# Patient Record
Sex: Female | Born: 1950 | Race: White | Hispanic: No | Smoking: Current every day smoker
Health system: Southern US, Community
[De-identification: ages and names within clinical notes are randomized; demographics above are authoritative.]

---

## 2010-01-29 ENCOUNTER — Telehealth (INDEPENDENT_AMBULATORY_CARE_PROVIDER_SITE_OTHER): Payer: Self-pay

## 2010-01-29 ENCOUNTER — Ambulatory Visit: Payer: Self-pay | Admitting: Emergency Medicine

## 2010-01-29 DIAGNOSIS — H60339 Swimmer's ear, unspecified ear: Secondary | ICD-10-CM | POA: Insufficient documentation

## 2010-05-24 NOTE — Progress Notes (Signed)
  Phone Note From Pharmacy   Caller: [facility]  Reason for Call: Patient requests substitution Summary of Call: Per physician, clld in change of prescription to Corticosporin Otic, 4 drops two times a day x10dys, 1 bottle to Marilyn/Pharmacist [address] - [place] - [phone]. Initial call taken by: Areta Haber CMA,  January 29, 2010 5:43 PM

## 2010-05-24 NOTE — Assessment & Plan Note (Signed)
Summary: R ear painx , HA, Cough-dry rm5   Vital Signs:  Patient Profile:   60 Years Old Female CC:      Cold & URI symptoms Height:     62 inches Weight:      161 pounds O2 Sat:      100 % O2 treatment:    Room Air Temp:     97.6 degrees F oral Pulse rate:   79 / minute Pulse rhythm:   regular Resp:     16 per minute BP sitting:   122 / 78  (left arm) Cuff size:   regular  Vitals Entered By: Areta Haber CMA (January 29, 2010 2:18 PM)                  Current Allergies: No known allergies History of Present Illness Chief Complaint: Cold & URI symptoms History of Present Illness: Pt with intermittant R ear pain for 3 years.  It comes and goes.  Hasn't seen a PCP.  Pain is dull and located R ear.  Occ drainage.  Some muffled hearing in R ear. She went to [facility] today and was told that she was impacted and that there was an infection.  Supposedly they wouldn't see her because they felt she needed pain medicines.  She is not a Counselling psychologist.  She uses Qtips after she showers. No sore throat No cough No pleuritic pain No wheezing No nasal congestion No post-nasal drainage No sinus pain/pressure No itchy/red eyes + earache No hemoptysis No SOB No chills/sweats No fever No nausea No vomiting No abdominal pain No diarrhea No skin rashes No fatigue No myalgias + headache   Current Problems: ACUTE SWIMMERS EAR (ICD-380.12)   Current Meds CIPRODEX 0.3-0.1 % SUSP (CIPROFLOXACIN-DEXAMETHASONE) 4 drops Right ear two times a day for 10 days  REVIEW OF SYSTEMS Constitutional Symptoms      Denies fever, chills, night sweats, weight loss, weight gain, and fatigue.  Eyes       Denies change in vision, eye pain, eye discharge, glasses, contact lenses, and eye surgery. Ear/Nose/Throat/Mouth       Complains of change in hearing, ear pain, and ear discharge.      Denies hearing loss/aids, dizziness, frequent runny nose, frequent nose bleeds, sinus problems,  sore throat, hoarseness, and tooth pain or bleeding.      Comments: R x 2 mths Respiratory       Complains of dry cough.      Denies productive cough, wheezing, shortness of breath, asthma, bronchitis, and emphysema/COPD.  Cardiovascular       Denies murmurs, chest pain, and tires easily with exhertion.    Gastrointestinal       Denies stomach pain, nausea/vomiting, diarrhea, constipation, blood in bowel movements, and indigestion. Genitourniary       Denies painful urination, kidney stones, and loss of urinary control. Neurological       Complains of headaches.      Denies paralysis, seizures, and fainting/blackouts. Musculoskeletal       Denies muscle pain, joint pain, joint stiffness, decreased range of motion, redness, swelling, muscle weakness, and gout.  Skin       Denies bruising, unusual mles/lumps or sores, and hair/skin or nail changes.  Psych       Denies mood changes, temper/anger issues, anxiety/stress, speech problems, depression, and sleep problems. Other Comments: Pt does not have a PCP.   Past History:  Past Medical History: Unremarkable  Past Surgical History:  Denies surgical history  Social History: Married Current Smoker - 1/2-1 pack daily Alcohol use-no Drug use-no Regular exercise-no Smoking Status:  current Drug Use:  no Does Patient Exercise:  no Physical Exam General appearance: well developed, well nourished, no acute distress.  older than stated age Head: normocephalic, atraumatic Ears: Left ear and TM normal.  Right TM normal.  Right canal swollen with mild exudate and cerumen present. Nasal: mucosa pink, nonedematous, no septal deviation, turbinates normal Oral/Pharynx: tongue normal, posterior pharynx without erythema or exudate Neck: no LAD present Chest/Lungs: no rales, wheezes, or rhonchi bilateral, breath sounds equal without effort Heart: regular rate and  rhythm, no murmur Skin: no obvious rashes or lesions MSE: oriented to time,  place, and person patient hears whisper in front and behind, but sometimes cocks her head to the side to listen Assessment New Problems: ACUTE SWIMMERS EAR (ICD-380.12)   Plan New Medications/Changes: CIPRODEX 0.3-0.1 % SUSP (CIPROFLOXACIN-DEXAMETHASONE) 4 drops Right ear two times a day for 10 days  #1 bottle x 0, 01/29/2010, Hoyt Koch MD  New Orders: No Charge Patient Arrived (NCPA0) [NCPA0] Planning Comments:   Will treat her Otitis externa with Ciprodex and OTC Ibuprofen.  Explained how to use medicine.  Advised her that she may use white vinegar otic once the medicine is gone to try to prevent further flares (a few times a week after she showers).  Avoid Qtips.  If she continues to have muffled hearing or problems after the course of medicine, she should seek further care (either to an audiologist to check hearing and/or ENT).   Follow-up with your primary care physician if not improving or if getting worse.   The patient and/or caregiver has been counseled thoroughly with regard to medications prescribed including dosage, schedule, interactions, rationale for use, and possible side effects and they verbalize understanding.  Diagnoses and expected course of recovery discussed and will return if not improved as expected or if the condition worsens. Patient and/or caregiver verbalized understanding.  Prescriptions: CIPRODEX 0.3-0.1 % SUSP (CIPROFLOXACIN-DEXAMETHASONE) 4 drops Right ear two times a day for 10 days  #1 bottle x 0   Entered and Authorized by:   Hoyt Koch MD   Signed by:   Hoyt Koch MD on 01/29/2010   Method used:   Print then Give to Patient   RxID:   [id number]   Orders Added: 1)  No Charge Patient Arrived (NCPA0) [NCPA0]

## 2011-06-17 ENCOUNTER — Emergency Department
Admission: EM | Admit: 2011-06-17 | Discharge: 2011-06-17 | Disposition: A | Discharge status: Home / Self Care | Payer: Self-pay | Source: Home / Self Care | Attending: Emergency Medicine | Admitting: Emergency Medicine

## 2011-06-17 ENCOUNTER — Emergency Department: Admit: 2011-06-17 | Discharge: 2011-06-17 | Disposition: A | Discharge status: Home / Self Care | Payer: Self-pay

## 2011-06-17 DIAGNOSIS — J189 Pneumonia, unspecified organism: Secondary | ICD-10-CM

## 2011-06-17 DIAGNOSIS — S43429A Sprain of unspecified rotator cuff capsule, initial encounter: Secondary | ICD-10-CM

## 2011-06-17 DIAGNOSIS — S46019A Strain of muscle(s) and tendon(s) of the rotator cuff of unspecified shoulder, initial encounter: Secondary | ICD-10-CM

## 2011-06-17 DIAGNOSIS — R509 Fever, unspecified: Secondary | ICD-10-CM

## 2011-06-17 DIAGNOSIS — J069 Acute upper respiratory infection, unspecified: Secondary | ICD-10-CM

## 2011-06-17 MED ORDER — AMOXICILLIN 500 MG PO CAPS: 500.0000 mg | ORAL_CAPSULE | Freq: Three times a day (TID) | ORAL | Status: DC

## 2011-06-17 MED ORDER — NAPROXEN 500 MG PO TABS: 500.0000 mg | ORAL_TABLET | Freq: Two times a day (BID) | ORAL | Status: AC | PRN

## 2011-06-17 MED ORDER — LEVOFLOXACIN 500 MG PO TABS: 750.0000 mg | ORAL_TABLET | Freq: Every day | ORAL | Status: AC

## 2011-06-17 MED ORDER — NEOMYCIN-POLYMYXIN-HC 3.5-10000-1 OT SUSP: 4.0000 [drp] | Freq: Three times a day (TID) | OTIC | Status: AC

## 2011-06-17 NOTE — ED Notes (Signed)
Right ear pain for years, upper left arm pain. Pt states its from her job.

## 2011-06-17 NOTE — ED Provider Notes (Signed)
History     CSN: [id number]  Arrival date & time 06/17/11  6962   First MD Initiated Contact with Patient 06/17/11 [phone]      Chief Complaint  Patient presents with  . Otalgia  . Arm Pain    (Consider location/radiation/quality/duration/timing/severity/associated sxs/prior treatment) HPI   this patient does not have insurance but comes in today complaining of multiple problems.  1) She is complaining of right ear pain for the last 3-4 years. She's been having some decreased hearing and some drainage out of the ear intermittently. She has not seen or establish with a primary care doctor or ENT. I asked him as she was here we gave her some ear drops that she has been using which seemed to help but she has not followed up with anybody else as advised.  2) she is also been complaining of fever and muscle aches and some right sided chest discomfort. She also states that she's been having some swelling on the anterior aspect of her chest. She is a smoker. She is not complaining of a cough. Very mild upper respiratory symptoms as well.  3) She is also complaining of left shoulder pain that hurts when she lifts up her left shoulder and arm.  His been going on for a while but she denies any falling tripping or accidents. She has not been taking any medications or using any modalities for her. Certain movements hurt her worse.   4)  She's also been complaining of cramps they're located it in her feet and in her right side. No GU issues or vaginal discomfort or dysuria.   No past medical history on file.  No past surgical history on file.  No family history on file.  History  Substance Use Topics  . Smoking status: Current Everyday Smoker  . Smokeless tobacco: Not on file  . Alcohol Use:     OB History    Grav Para Term Preterm Abortions TAB SAB Ect Mult Living                  Review of Systems  All other systems reviewed and are negative.    Allergies  Review of patient's  allergies indicates no known allergies.  Home Medications   Current Outpatient Rx  Name Route Sig Dispense Refill  . LEVOFLOXACIN 500 MG PO TABS Oral Take 1.5 tablets (750 mg total) by mouth daily. 10 tablet 0  . NAPROXEN 500 MG PO TABS Oral Take 1 tablet (500 mg total) by mouth 2 (two) times daily as needed. 40 tablet 0  . NEOMYCIN-POLYMYXIN-HC 3.5-10000-1 OT SUSP Right Ear Place 4 drops into the right ear 3 (three) times daily. 10 mL 0    There were no vitals taken for this visit.  Physical Exam  Nursing note and vitals reviewed. Constitutional: She is oriented to person, place, and time. She appears well-developed and well-nourished.  HENT:  Head: Normocephalic and atraumatic.  Right Ear: External ear and ear canal normal. Tympanic membrane is erythematous. Decreased hearing is noted.  Left Ear: Tympanic membrane, external ear and ear canal normal.  Nose: Mucosal edema and rhinorrhea present.  Mouth/Throat: Posterior oropharyngeal erythema present. No oropharyngeal exudate or posterior oropharyngeal edema.       Erythema in the ear canal on the right side with swelling  Eyes: No scleral icterus.  Neck: Neck supple.  Cardiovascular: Normal rate, regular rhythm and normal heart sounds.   Pulmonary/Chest: Effort normal and breath sounds normal. No respiratory  distress. She has no decreased breath sounds. She has no wheezes. She has no rhonchi.       She does have an area of swelling on her anterior chest. It feels soft and feels intercostal.  Musculoskeletal:       Left shoulder examination demonstrates full range of motion but pain with overhead  Movements.  Neer's and Hawkins examinations are positive for pain. No tenderness to palpation. Distal neurovascular status is intact.  Neurological: She is alert and oriented to person, place, and time.  Skin: Skin is warm and dry.  Psychiatric: She has a normal mood and affect. Her speech is normal.    ED Course  Procedures (including  critical care time)  Labs Reviewed - No data to display Dg Chest 2 View  06/17/2011  *RADIOLOGY REPORT*  Clinical Data: Chest pain  CHEST - 2 VIEW  Comparison: None.  Findings: Left basilar heterogeneous opacities.  Normal heart size. Bronchitic changes.  No pneumothorax.  IMPRESSION: Left basilar airspace disease.  Original Report Authenticated By: Donavan Burnet, M.D.     1. Acute upper respiratory infections of unspecified site       MDM  1) she seems to have an otitis externa the right ear. I've given her eardrops. I would like her to follow up with ENT, however she states that she cannot afford to see one. I would like her to do so because she is having decreased hearing from this and has been going on for years at a time.  2) we did a chest x-ray in clinic in the results are read by radiology as above. I have given her prescription for Levaquin. I would like her to stop smoking as well.  3) her left shoulder pain is likely due to rotator cuff strain she has some signs of impingement. I've given her a prescription for naproxen and told her to ice it. She likely needs followup with sports medicine provider but states she cannot afford that either.  4) cramps in both feet as well as in her legs and side may be due to simple cramps or may be due to an electrolyte imbalance. We cannot evaluate these today.  I've gotten the phone number for from the daughter since she seems to be the most reliable. I would have my office manager call the family early this week and hopefully set up some type of primary care visit since this patient definitely needs a primary care doctor to follow her if not multiple specialists. She is having multiple medical issues including pneumonia and arthritis and chronic ear infections and I think that without a physician she will tend to get worse and not better.  Lily Kocher, MD 06/17/11 1154

## 2014-03-18 ENCOUNTER — Emergency Department
Admission: EM | Admit: 2014-03-18 | Discharge: 2014-03-18 | Disposition: A | Discharge status: Home / Self Care | Payer: Self-pay | Source: Home / Self Care | Attending: Emergency Medicine | Admitting: Emergency Medicine

## 2014-03-18 ENCOUNTER — Emergency Department (INDEPENDENT_AMBULATORY_CARE_PROVIDER_SITE_OTHER): Payer: Self-pay

## 2014-03-18 DIAGNOSIS — M545 Low back pain, unspecified: Secondary | ICD-10-CM

## 2014-03-18 DIAGNOSIS — N3001 Acute cystitis with hematuria: Secondary | ICD-10-CM

## 2014-03-18 DIAGNOSIS — M47816 Spondylosis without myelopathy or radiculopathy, lumbar region: Secondary | ICD-10-CM

## 2014-03-18 LAB — POCT URINALYSIS DIP (MANUAL ENTRY)
Bilirubin, UA: NEGATIVE
Glucose, UA: NEGATIVE
Ketones, POC UA: NEGATIVE
Nitrite, UA: POSITIVE
Protein Ur, POC: NEGATIVE
Spec Grav, UA: 1.02 (ref 1.005–1.03)
Urobilinogen, UA: 1 (ref 0–1)
pH, UA: 5 (ref 5–8)

## 2014-03-18 MED ORDER — KETOROLAC TROMETHAMINE 60 MG/2ML IM SOLN
60.0000 mg | Freq: Once | INTRAMUSCULAR | Status: AC
  Administered 2014-03-18: 60 mg via INTRAMUSCULAR

## 2014-03-18 MED ORDER — CIPROFLOXACIN HCL 500 MG PO TABS: 500.0000 mg | ORAL_TABLET | Freq: Two times a day (BID) | ORAL | Status: DC

## 2014-03-18 MED ORDER — TRAMADOL-ACETAMINOPHEN 37.5-325 MG PO TABS: ORAL_TABLET | ORAL | Status: DC

## 2014-03-18 NOTE — ED Provider Notes (Signed)
CSN: [id number]     Arrival date & time 03/18/14  1919 History   First MD Initiated Contact with Patient 03/18/14 1947     Chief Complaint  Patient presents with  . Back Pain    x 2 days   [facility] Patient is a 63 y.o. female presenting with back pain.  Back Pain Location:  Lumbar spine Quality: Sharp. Radiates to:  Does not radiate Pain severity:  Severe Pain is:  Unable to specify Onset quality:  Gradual Duration:  2 days Timing:  Constant Progression:  Unchanged Chronicity:  New (Although she has chronic mild low back pain and arthritis diffusely, she feels the lumbar pain past 2 days is severe) Context: lifting heavy objects and twisting   Context: not falling, not jumping from heights, not pedestrian accident, not recent illness and not recent injury   Context comment:  She was stacking wood 3 days ago, then the next morning, the lumbar pain started Relieved by:  Being still Worsened by:  Movement Ineffective treatments:  Ibuprofen Associated symptoms: no abdominal pain, no bladder incontinence, no bowel incontinence, no chest pain, no dysuria, no fever, no headaches, no leg pain, no numbness, no paresthesias, no pelvic pain, no perianal numbness, no tingling and no weakness   Risk factors: no recent surgery    Past medical history of pneumonia 2013 treated effectively as an outpatient with an antibiotic and the pneumonia symptoms resolved. She states that otherwise she has not been to a medical doctor in about 30 years and she absolutely refuses to follow-up with a medical doctor. I explained the risks of not doing so. She denies any urinary symptoms currently. Denies hematuria or dysuria or urinary incontinence. Denies urinary frequency. Denies fever or chills or nausea or vomiting. She denies cough or chest pain or shortness of breath or wheezing. History reviewed. No pertinent past medical history. History  reviewed. No pertinent past surgical history. Family History  Problem Relation Age of Onset  . Heart attack Father   . Cancer Sister     Breast   History  Substance Use Topics  . Smoking status: Current Every Day Smoker -- 0.50 packs/day for 50 years    Types: Cigarettes  . Smokeless tobacco: Never Used  . Alcohol Use: No   OB History    No data available     Review of Systems  Constitutional: Negative for fever.  Cardiovascular: Negative for chest pain.  Gastrointestinal: Negative for abdominal pain and bowel incontinence.  Genitourinary: Negative for bladder incontinence, dysuria and pelvic pain.  Musculoskeletal: Positive for back pain.  Neurological: Negative for tingling, weakness, numbness, headaches and paresthesias.  All other systems reviewed and are negative.   Allergies  Review of patient's allergies indicates no known allergies.  Home Medications   Prior to Admission medications   Medication Sig Start Date End Date Taking? Authorizing Provider  ciprofloxacin (CIPRO) 500 MG tablet Take 1 tablet (500 mg total) by mouth 2 (two) times daily. 03/18/14   Lajean Manesavid Massey, MD  traMADol-acetaminophen (ULTRACET) 37.5-325 MG per tablet 1 or 2 every 4-6 hours as needed for severe pain.  Caution: May cause drowsiness 03/18/14   Lajean Manesavid Massey, MD   BP 118/79 mmHg  Pulse 86  Temp(Src) 97.4 F (36.3 C) (Oral)  Ht 5\' 2"  (1.575 m)  Wt 165 lb (74.844 kg)  BMI 30.17 kg/m2  SpO2 95% Physical Exam  Constitutional: She is oriented to person, place,  and time. She appears well-developed and well-nourished. No distress.  Here with daughter. Uncomfortable from lumbar pain. She points to the mid lumbar area.  HENT:  Head: Normocephalic and atraumatic.  Mouth/Throat: Oropharynx is clear and moist.  Poor dentition  Eyes: Conjunctivae and EOM are normal. Pupils are equal, round, and reactive to light. No scleral icterus.  Neck: Normal range of motion. Neck supple. No JVD present.    Cardiovascular: Normal rate, regular rhythm and normal heart sounds.   Pulmonary/Chest: Effort normal and breath sounds normal. No respiratory distress.  Abdominal: Soft. She exhibits no distension. There is no tenderness.  Musculoskeletal:       Lumbar back: She exhibits decreased range of motion, tenderness and bony tenderness. She exhibits no swelling, no laceration and no spasm.       Back:  Lymphadenopathy:    She has no cervical adenopathy.  Neurological: She is alert and oriented to person, place, and time. She displays normal reflexes. No cranial nerve deficit. She exhibits normal muscle tone.  Motor and sensory and DTRs of lower extremities are equal and intact  Skin: Skin is warm. No rash noted. She is not diaphoretic.  Psychiatric: She has a normal mood and affect.  Nursing note and vitals reviewed.  straight leg raise test negative. No hip tenderness or deformity  ED Course  Procedures (including critical care time) Labs Review Labs Reviewed  POCT URINALYSIS DIP (MANUAL ENTRY) - Abnormal; Notable for the following:   URINE CULTURE   Results for orders placed or performed during the hospital encounter of 03/18/14  POCT urinalysis dipstick (new)  Result Value Ref Range   Color, UA yellow    Clarity, UA clear    Glucose, UA neg    Bilirubin, UA negative    Bilirubin, UA negative    Spec Grav, UA 1.020 1.005 - 1.03   Blood, UA trace-lysed    pH, UA 5.0 5 - 8   Protein Ur, POC negative    Urobilinogen, UA 1.0 0 - 1   Nitrite, UA Positive    Leukocytes, UA small (1+)     Discussed imaging options with patient and daughter. I advised x-ray LS-spine and they consented to do 2-3 view x-ray LS-spine.  Imaging Review Dg Lumbar Spine 2-3 Views  03/18/2014   CLINICAL DATA:  Low back pain for 2 days.  EXAM: LUMBAR SPINE - 2-3 VIEW  COMPARISON:  None.  FINDINGS: Degenerative facet disease in the lower lumbar spine with bony overgrowth. Normal alignment. Disc spaces are  maintained. No fracture. SI joints and hip joints are symmetric.  IMPRESSION: No acute bony abnormality.  Degenerative facet disease.   Electronically Signed   By: Charlett NoseKevin  Dover M.D.   On: 03/18/2014 20:16     MDM      1 Low back pain   2 Acute cystitis with hematuria    Urinalysis is positive for trace blood, positive nitrite and small leukocytes. Patient admits it might not be a perfect clean catch UA. (She refuses to repeat UA)  No urinary symptoms, but I'm suspicious of acute cystitis as a secondary diagnosis. I doubt that her acute lumbar pain is from a kidney stone, as she has point tenderness over midline lumbar area, and pain is exacerbated with movement.  X-ray LS-spine shows no acute bony abnormality. There is degenerative facet disease. She likely has acute lumbar strain. Also has  asymptomatic acute cystitis.--No CVA tenderness.  Treatment options discussed, as well as risks, benefits, alternatives.  Patient and daughter voiced understanding and agreement with the following plans: Send off urine for culture. Toradol 60 mg IM stat.-She tolerated this well without reaction, was observed, and lumbar pain improved somewhat afterward. New Prescriptions   CIPROFLOXACIN (CIPRO) 500 MG TABLET    Take 1 tablet (500 mg total) by mouth 2 (two) times daily.   TRAMADOL-ACETAMINOPHEN (ULTRACET) 37.5-325 MG PER TABLET    1 or 2 every 4-6 hours as needed for severe pain.  Caution: May cause drowsiness   discussed relative rest and gradually increase activity. Heat and/or cold packs. Explained the need to follow-up with her PCP for follow-up of back pain abnormal urinalysis. And I explained the need for follow-up with PCP ongoing for preventative and acute care. Names and phone numbers of PCPs given. I explained if she does not follow-up with PCP, she has risks of other undiagnosed medical problems which could lead to severe morbidity or even death. She and daughter voiced understanding, but  patient still refuses to follow-up with her PCP. Advised her to quit smoking and explained the risks of smoking, but she refuses to quit.  Follow-up with ortho or primary care doctor in 5-7 days if not improving, or sooner if symptoms become worse. Precautions discussed. Red flags discussed.--- Emergency room if any red flag Questions invited and answered. Patient and daughter voiced understanding and agreement. Over 40 minutes spent, greater than 50% of the time spent for counseling and care.   Lajean Manes, MD 03/18/14 [phone]

## 2014-03-18 NOTE — ED Notes (Signed)
Stefanie Wilson complains of low back pain for 2 days. Pain is 10/10 and is worse when beginning to stand and walking. She did take some ibuprofen this morning with no relief. Denies fever, chills, sweats or urinary problems.

## 2014-03-21 LAB — URINE CULTURE: Colony Count: >=100000

## 2014-03-22 ENCOUNTER — Telehealth: Payer: Self-pay

## 2014-03-23 ENCOUNTER — Telehealth: Payer: Self-pay | Admitting: Emergency Medicine

## 2014-03-23 NOTE — Discharge Instructions (Signed)

## 2017-05-15 DIAGNOSIS — H25811 Combined forms of age-related cataract, right eye: Secondary | ICD-10-CM | POA: Insufficient documentation

## 2017-05-15 NOTE — Anesthesia Preprocedure Evaluation (Addendum)
  Relevant Problems  No relevant active problems     Anesthesia Evaluation    No history of anesthetic complications  Airway  Mallampati: II    Neck ROM: full  Dental   (+) lower dentures and upper dentures   Pulmonary   (+) tobacco use, a smoker pack-years,  Counseling provided  Cardiovascular - negative ROS   Neuro/Psych    Comments: Hard of hearing: wears bilat hearing aids.  GI/Hepatic/Renal - negative ROS    Endo/Other - negative ROS   Abdominal       Other ROS/MED HX:  HPI: cataract       Anesthesia Plan    ASA 2   MAC  (Multimodal analgesia plan: acetaminophen  and gabapentin  ordered pre-operatively. Patient seen. Chart reviewed. All questions answered. Ready for anesthesia and surgery. Alm Dayhoff, MD  05/15/2017   8:14 AM  ) Anesthetic plan and risks discussed with patient.     Plan discussed with CRNA.

## 2017-05-24 DIAGNOSIS — H25812 Combined forms of age-related cataract, left eye: Secondary | ICD-10-CM | POA: Insufficient documentation

## 2019-03-04 DIAGNOSIS — F17210 Nicotine dependence, cigarettes, uncomplicated: Secondary | ICD-10-CM | POA: Insufficient documentation

## 2019-03-04 DIAGNOSIS — Z974 Presence of external hearing-aid: Secondary | ICD-10-CM | POA: Insufficient documentation

## 2019-03-04 NOTE — Progress Notes (Signed)
 Subjective   Patient ID:  Stefanie Wilson is a 68 y.o. (DOB 08-08-1950) female.   Chief Complaint  Patient presents with  . Get established     [name] is a new patient to our practice who presents today to establish care.  She has not been seen by primary care in over 35 years and has not had any health maintenance activities recently.  She reports she is overall healthy.  She does have a history of cataracts surgery on bilateral eyes in 2019.  Her vision is now 20/20 but she does require reading glasses occasionally.  Patient does use hearing aids but denies any additional past medical history.  Patient is a current everyday smoker smoking approximately 0.5 packs/day.  She is not been able to successfully quit smoking in the past.  She is not interested in smoking cessation at this time.  She denies history of COPD or any significant symptoms including shortness of breath, cough, dyspnea on exertion.  She has not taken pharmacotherapy in the past to help quit smoking and is not interested in initiating this today.  Patient has never had a mammogram.  She denies family history of breast cancer.  She does not do self breast exams and denies any concerns.  She declines mammogram referral today.  Patient has never had any colon cancer screening.  She declines colonoscopy or Cologuard but is open to IFOB annual testing.  She denies any changes to bowel habits, abdominal pain, melena, hematochezia.  She does not receive vaccines on regular basis and declines flu shot, zoster vaccine, Tdap, and pneumonia vaccines today.  She has not had a Pap smear in more than 35 years.  She denies history of abnormal Pap smears and is open to screening today.  Patient is currently fasting and is open to having a lipid panel and fasting glucose level obtained today.  Patient reports the only reason she is here is because her insurance told her that she did not have a physical within a certain timeframe she  would lose the insurance coverage.  She is not interested in many of the health maintenance activities.  She reports she has lived a healthy life and does not take medication on a regular basis.  She has no additional complaints or concerns today.   Reviewed and updated this visit by provider: Tobacco  Allergies  Meds  Problems  Med Hx  Surg Hx  Fam Hx        Review of Systems Review of Systems  Constitutional: Negative for activity change, appetite change, fatigue and fever.  HENT: Negative for congestion, sinus pressure, sneezing and sore throat.   Eyes: Negative for visual disturbance.  Respiratory: Negative for cough and shortness of breath.   Cardiovascular: Negative for chest pain.  Gastrointestinal: Negative for abdominal pain, diarrhea, nausea and vomiting.  Musculoskeletal: Negative for arthralgias and myalgias.  Skin: Negative for rash.  Neurological: Negative for dizziness, light-headedness and headaches.  Psychiatric/Behavioral: Negative for sleep disturbance. The patient is not nervous/anxious.      Objective   Vitals:   03/04/19 1054  BP: 120/80  BP Location: Right arm  Patient Position: Sitting  Pulse: 81  Temp: 97.7 F (36.5 C)  TempSrc: Skin  SpO2: 96%  Weight: 160 lb 8 oz (72.8 kg)  Height: 5' 1 (1.549 m)    Physical Exam Vitals signs reviewed. Exam conducted with a chaperone present.  Constitutional:      General: She is not in acute  distress.    Appearance: Normal appearance. She is well-developed.     Comments: Very pleasant female appears stated age in no acute distress  HENT:     Head: Normocephalic and atraumatic.     Right Ear: Tympanic membrane, ear canal and external ear normal. There is no impacted cerumen. Tympanic membrane is not erythematous or bulging.     Left Ear: Tympanic membrane, ear canal and external ear normal. There is no impacted cerumen. Tympanic membrane is not erythematous or bulging.     Ears:     Comments: TMs  opaque bilaterally.    Nose: Nose normal.     Mouth/Throat:     Dentition: Has dentures.     Pharynx: Uvula midline. No oropharyngeal exudate or posterior oropharyngeal erythema.  Eyes:     Extraocular Movements: Extraocular movements intact.     Conjunctiva/sclera: Conjunctivae normal.     Pupils: Pupils are equal, round, and reactive to light.  Neck:     Musculoskeletal: Normal range of motion and neck supple.  Cardiovascular:     Rate and Rhythm: Normal rate and regular rhythm.     Heart sounds: S1 normal and S2 normal. No murmur.  Pulmonary:     Effort: Pulmonary effort is normal. No accessory muscle usage or respiratory distress.     Breath sounds: Normal breath sounds. No wheezing, rhonchi or rales.     Comments: Clear to auscultation bilaterally Chest:     Breasts:        Right: No swelling, bleeding or inverted nipple.        Left: No swelling, bleeding or inverted nipple.  Abdominal:     Palpations: Abdomen is soft.     Tenderness: There is no abdominal tenderness.  Genitourinary:    Labia:        Right: No rash or tenderness.        Left: No rash or tenderness.      Vagina: Normal. No vaginal discharge or erythema.     Cervix: Normal.     Uterus: Normal.      Adnexa: Right adnexa normal and left adnexa normal.     Comments: Burnard Cable, CMA present as chaperone during exam Lymphadenopathy:     Head:     Right side of head: No submental, submandibular or tonsillar adenopathy.     Left side of head: No submental, submandibular or tonsillar adenopathy.     Cervical: No cervical adenopathy.  Neurological:     Mental Status: She is alert.  Psychiatric:        Mood and Affect: Mood normal.        Speech: Speech normal.        Behavior: Behavior is cooperative.    PHQ9    Depression Screen 03/04/2019 05/24/2017 05/15/2017   Please choose the category that best describes the patient's current state 0 - -   Not eligible on the basis of Not applicable - -   1. Little  interest or pleasure in doing things 0 - -   2. Feeling down, depressed, or hopeless 0 - -   PHQ-2 Total Score 0 - -   Wish to be Dead: - No No   Suicidal Thoughts: - No No   Suicide Behavior Question: - No No       --------------------------- Labs last 12 hours No results found for this or any previous visit (from the past 12 hour(s)).  Imaging last 24 hours (if applicable)  Radiology interpretation  No results found.   Impression/Plan   1. History of cataract (Primary)  Patient reports she is doing well and has scheduled follow-up with ophthalmology.  She was instructed to contact us  with any questions or concerns.  2. Cigarette smoker  Discussed at length the importance of smoking cessation for overall health.  Patient reports she is not interested in smoking cessation at this time.  She has slowly decreased the amount of cigarettes that she is consuming daily but does not intend to completely quit now or in the future.  We discussed possible smoking cessation strategies including pharmacotherapy should she ever change her mind.  If she is interested in smoking cessation in the future she can contact us .  3. Hearing aid worn  Patient is followed by audiologist and reports she is doing well.  4. Colon cancer screening -     POCT IMMUNO FECAL OCCULT BLOOD(IFOB)  Patient declined colonoscopy and Cologuard but was open to annual IFOB testing.  Will make additional recommendations based on laboratory findings.  5. Immunization refused  Patient refused immunizations today.  6. Mammogram declined  Patient declined mammogram referral.  Clinical breast exam was normal in office.  She was encouraged to do monthly breast exams and contact us  with any concerns.  7. Screening for endocrine, metabolic and immunity disorder 8. Screening, lipid -     Lipid Panel -     Comprehensive metabolic panel  Patient is currently fasting for labs so screening lipid panel and CMP including  fasting glucose level were obtained.  Will make recommendations based on laboratory findings.  9. Cervical cancer screening -     IGP, HPV, Aptima High-Risk  Pap smear was collected today given patient has not had regular screenings with last Pap greater than 35 years ago.  Will make recommendations based on laboratory findings.  10. Encounter to establish care PHQ2 score of 0 today.  Patient is scheduled for AWV next week.  Will readdress health maintenance activities at this visit.  See encounter after visit summary for patient specific instructions.  Follow up in about 1 week (around 03/11/2019) for AWV.   Orders Placed This Encounter  Procedures  . Lipid Panel  . Comprehensive metabolic panel  . POCT IMMUNO FECAL OCCULT BLOOD(IFOB)  . IGP, HPV, Aptima High-Risk     Patient's Medications    as of March 04, 2019 12:02 PM   You have not been prescribed any medications.     Risks, benefits, and alternatives of the medications and treatment plan prescribed today were discussed, and patient expressed understanding. Plan follow-up as discussed or as needed if any worsening symptoms or change in condition.    A yearly health maintenance exam was recommended where appropriate.   This documented was generated using voice recognition software. There may be unintended transcription errors that were not detected upon document review.

## 2019-03-11 DIAGNOSIS — E1169 Type 2 diabetes mellitus with other specified complication: Secondary | ICD-10-CM | POA: Insufficient documentation

## 2019-03-11 DIAGNOSIS — E782 Mixed hyperlipidemia: Secondary | ICD-10-CM | POA: Insufficient documentation

## 2019-03-12 NOTE — Progress Notes (Signed)
 Medicare Initial AWV   Stefanie Wilson is a 68 y.o. female who presents for her first annual wellness visit for Medicare.  Any physical exam components or additional concerns beyond the scope of the Annual Wellness Visit may be documented in a separate note within this encounter.  Health Risk Assessment   Current Living Arrangement: Spouse/Significant Other Social Support System: Family During the past four weeks, how much pain in your body have you had on a scale of 0-10?: No pain (0) During the past four weeks, was someone available to help you if you needed and wanted help?: Yes, as much as I wanted During the past four weeks, what was the hardest level of physical activity you could do for at least two minutes?: Very heavy In the past seven days, how many days did you exercise?: 7 In the past seven days, how many minutes per day did you exercise?: 60 Each night, how many hours of sleep do you usually get?: 4 Do you snore or has anyone told you that you snore?: No Do you always fasten your seat belt when you are in a car?: Yes Do you drive after drinking alcohol or ride with a driver who has been drinking?: No During the past four weeks, how many drinks of wine, beer or other alcoholic beverages did you have?: No alcohol at all How often during the past four weeks have you been bothered by falling or dizziness when standing up?: Never How often during the past four weeks have you been bothered by sexual problems?: Never Do any of the following describe you?  Multiple sex partners and/or intercourse with partner of the same sex: No How often during the past four weeks have you been bothered by trouble eating well?: Never How often during the past four weeks have you been bothered by teeth or denture problems?: Never How often during the past four weeks have you been bothered by tiredness or fatigue?: Never What is your current stress level?: Low Does stress affect the quality of  your daily life?: No During the past four weeks, how would you rate your health in general?: Excellent What is your race? (Check all that apply): White  Depression Screening   Depression Screen 03/12/2019 03/04/2019 05/24/2017 05/15/2017  Please choose the category that best describes the patient's current state 0 0 - -  Not eligible on the basis of Not applicable Not applicable - -  1. Little interest or pleasure in doing things 0 0 - -  2. Feeling down, depressed, or hopeless 0 0 - -  PHQ-2 Total Score 0 0 - -  Wish to be Dead: - - No No  Suicidal Thoughts: - - No No  Suicide Behavior Question: - - No No  Some recent data might be hidden     Cognitive and Functional Assessments   Is the person deaf or does he/she have serious difficulty hearing?: (!) Yes(wears hearing aids) Is this person blind or does he/she have serious difficulty seeing even when wearing glasses?: No Do you/patient have serious difficulty concentrating, remembering, or making decisions?: No Have you had any concerns about changes in your memory or concentration?: No   Max Score Patient Score Mini-Mental Status Examination (MMSE) Questions  5 4 "What is the year? Season? Date? Day? Month?" (1 point each correct answer)  5 5 "Where are we now - Country? State? County? Town/city? Type of Building/Business? (1 point each correct answer)  3 3 The examiner names  three unrelated objects (blue, hope, tree) clearly and slowly, then the instructor asks the patient to name all three of them. The patient's response is used for scoring. (1 point each correct answer) The examiner repeats them until patient learns all of them, if possible.  5 4 "I would like you to count backward from 100 by sevens." (93, 86, 79, 72, 65), 1 point for each successful step.  Alternative for those not good at math: "Spell WORLD backwards." ([id number]), 1 point for each successful letter in reverse order.    3 2 "Earlier I told you the names of  three things. Can you tell me what those were?"  1 point for each remembered item.  2 2 Show the patient two simple objects, such as a wristwatch and a pencil, and ask the patient to name them. (1 point each)  1 1 "Repeat the phrase: 'No ifs, ands, or buts.'"  3 3 "Take the paper in your right hand, fold it in half, and put it on the floor." (The examiner gives the patient a piece of blank paper and scores 1 point for each step taken correctly.)  1 1  Write "Close your eyes." on the paper, give it to the patient and say, "Please read this and do what it says."  1 1 "Make up and write a sentence about anything." (This sentence must contain a noun and a verb and have correct punctuation.)   1 1 Ask the patient to draw a clock face, with an hour and minute hand showing the time at 5:45.  30 27 Total   Patient did have some initial difficulty with Mini-Mental status related to education.   ADL Assessment   Does this person have serious difficulty walking or climbing stairs?: No What devices do you use to assist with ambulation?: None Does this person have difficulty dressing or bathing?: No Do you have difficulty eating or preparing food?: No Do you have difficulty using the toilet?: No Do you have difficulty moving around from place to place?: No Do you have difficulty managing your finances?: No Do you have difficulty shopping or going to appointments without assistance?: No Do you have difficulty using the telephone?: No Do you have difficulty managing your medications?: No Who helps you with your medications?: No one Do you have difficulty doing housework or laundry?: No Do you have difficulty driving?: No(patient doesn't drive) How do you obtain transportation?: Family member  Advance Care Directives   Do you have a living will?: (!) No Do you have a Midwife?: (!) No  Falls Risk Screening       Patient can ambulate: Yes In the last year, have you had 2 or  more falls, trips or stumbles where you landed on the ground?: No In the last year, have you had 1 or more falls, trips or stumbles where you hurt yourself?: No  Intervention: Patient information provided on ways to reduce risk of falls  Medicare Required Components   Social History section has been updated:: Yes The following additional chart sections were updated:: Past Medical History, Past Surgical History, Medications, Allergies, Family History  Cognitive screen indicated?: No Based on my direct observation, with due consideration of information obtained via beneficiary reports and communication with family members/care takers, further cognitive assessment is not indicated.  Cognitive screen performed?: Yes Does patient have difficulty with hearing?: Yes(has hearing aids) Audiometric assessment and/or referral to Audiology ordered:: No(patient declined) Cardiac Risk Factors:: Advanced age (female  43 or older, female 42 or older), Dyslipidemia, Family history of premature CVA, Tobacco use, Obesity (BMI 30 kg/m2 or greater) Dietary issues addressed:: Yes HRA completed and reviewed:: Yes Care Team updated:: Yes Advance care directives discussed and information provided if necessary:: Yes   Patient Care Team: Rocky MARLA Gilford, PA-C as PCP - General (Physician Assistant) Belvie DELENA Novel, MD as Consulting Physician (Ophthalmology)  Vitals and Data    Vitals:   03/12/19 1325  BP: 116/70  Pulse: 83  Temp: 97.5 F (36.4 C)  TempSrc: Skin  Resp: 12  Height: 5' 1 (1.549 m)  Weight: 163 lb (73.9 kg)  SpO2: 93%  BMI (Calculated): 30.8  PainSc: 0-No pain    Hearing  Hearing Screening   125Hz  250Hz  500Hz  1000Hz  2000Hz  3000Hz  4000Hz  6000Hz  8000Hz   Right ear:   Fail Fail Fail  Fail    Left ear:   Fail Fail Fail  Fail    Comments: Patient wears hearing aids.  Patient was offered referral to audiology specialist which she declined.  She has someone she has worked with for her  hearing aids in the past and would like to go back to them when it is time to update her hearing aids.  Disposition   1. Medicare annual wellness visit, initial    2. Mammogram declined    3. Immunization refused    4. Colonoscopy refused      Follow up in about 3 months (around 06/12/2019) for needs 30 minutes.  No orders of the defined types were placed in this encounter.     Patient's Medications       Accurate as of March 12, 2019  1:58 PM. Reflects encounter med changes as of last refresh        Continued Medications     Instructions  atorvastatin  20 mg tablet Commonly known as: LIPITOR  20 mg, Oral, Daily        Health maintenance issues including appropriate cancer screening, annual eye exam, healthy diet, exercise and tobacco avoidance were discussed with the patient.  A written plan was provided to the patient in the form of patient instructions in the after visit summary.  Again discussed the importance of cancer screening including mammogram which patient declined.  She also declined colonoscopy or other forms of colon cancer screening.  Discussed that she is due for several vaccinations but she declined all of these.

## 2019-03-14 NOTE — Progress Notes (Signed)
 Chart reviewed complete for [employer] forms project.  Form submitted for AWV DOS 03/12/19.

## 2019-06-11 NOTE — Progress Notes (Signed)
 Subjective    Patient ID:  Stefanie Wilson is a 69 y.o. (DOB 10-Nov-1950) female.     Patient presents with  . Back Pain    X 6 days  bending to pick up wood- has had back pain in the past    No notes on file   Patient presents accompanied with her husband with complaint of lower back pain x 06/06/2019.[name]  Patient states that she bent down to pick a piece of wood from her porch and that is when she felt this pain.  She states she has had similar symptoms in the past but denies a history of chronic back pain. She is also fasting today, needs labs done to follow-up on lipids since she was started on statin following her labs in November 2020.   Aggravating factors: Coughing, bending over, walking, turning in bed Alleviating factors: Heating pad, Ice, Advil-no help  Sleep is decreased due to this pain.  denies numbness, tingling, and radiculopathy of the lower extremities.  denies lower extremity weakness. Negative loss of bowel or bladder control.  denies saddle anesthesia.   No difficulty urinating. Negative dysuria, urgency, or frequency. mild muscle spasm. Symptoms do worsen with change of position.  Functionality-no limitations with ADLs.  Denies associated appetite changes, fever, chills, fecal or urinary incontinence, neurological complaints, night sweats or weight loss.  Denies recent or remote direct trauma to lower back or falls.  Denies history of diabetes mellitus, IV drug use, cancer.    Reviewed and updated this visit by provider: Tobacco  Allergies  Meds  Problems  Med Hx  Surg Hx  Fam Hx        Review of Systems  Constitutional: Negative for appetite change, chills, fatigue, fever and unexpected weight change.  HENT: Negative for congestion, rhinorrhea, sinus pressure and sore throat.   Respiratory: Negative for cough, shortness of breath and wheezing.   Cardiovascular: Negative for chest pain, palpitations and leg swelling.  Gastrointestinal:  Negative for abdominal pain, blood in stool, constipation, diarrhea, nausea and vomiting.  Skin: Negative for color change, rash and wound.  Psychiatric/Behavioral: Negative for agitation, behavioral problems and confusion.     Objective   BP 140/80 (BP Location: Right arm, Patient Position: Sitting)   Pulse 78   Temp 97.1 F (36.2 C) (Skin)   Resp 18   Ht 5' 1 (1.549 m)   Wt 169 lb (76.7 kg)   SpO2 96%   Breastfeeding No   BMI 31.93 kg/m  No LMP recorded. Patient is postmenopausal. 24 Hour Pain Score (last day)    Date/Time  Pain Score   06/11/19 0932   Nine          Physical Exam  General:  Well Developed, Well Nourished, No distress Neck:  Supple with normal range of motion, No lymphadenopathy  Cardiovascular:  Regular rate and rhythm Lungs:  Clear to auscultation with normal effort Abdomen:  Bowel sounds active, soft, non-tender, non-distended, no hepatosplenomegaly or masses palpable.  No CVA tenderness.  Skin:  No Focal rashes, bruising, or contusions.    Musculoskeletal/Neuro:   No redness, deformity, bruising, swelling noted on inspection. No mass or swelling noted on palpation.  5/5 strength and resistance bilaterally.   DTR 2+ and equal bilaterally in the lower extremeties.  Pedal pulses intact 2+ = bilaterally.   Distal sensation intact.  ROM:  Limited due to pain- Forward flexion and extension, but has full lateral flexion, rotation with some pain on extremes  No  point tenderness to T,L-spine or sacrum.  TTP of paraspinal lower lumbar area noted predominantly on the right side.  No obvious muscle spasms .    Antalgic gait, no assistive devices used  Negative straight leg raise bilaterally.   Walks and climbs onto table with some difficulty and needed assistance      Assessment   1. Acute midline low back pain without sciatica  predniSONE (DELTASONE) 10 mg tablet   methocarbamol (ROBAXIN) 500 MG tablet  2. Class 1 obesity due to excess calories  without serious comorbidity with body mass index (BMI) of 31.0 to 31.9 in adult  Comprehensive Metabolic Panel   Hemoglobin A1c   Lipid Panel   Albumin/Creatinine Ratio, Random Urine   CBC   CBC   Albumin/Creatinine Ratio, Random Urine   Lipid Panel   Hemoglobin A1c   Comprehensive Metabolic Panel  3. Mixed hyperlipidemia  Comprehensive Metabolic Panel   Hemoglobin A1c   Lipid Panel   Albumin/Creatinine Ratio, Random Urine   CBC   CBC   Albumin/Creatinine Ratio, Random Urine   Lipid Panel   Hemoglobin A1c   Comprehensive Metabolic Panel  4. Cigarette smoker    5. Impaired fasting glucose  Comprehensive Metabolic Panel   Hemoglobin A1c   Lipid Panel   Albumin/Creatinine Ratio, Random Urine   CBC   CBC   Albumin/Creatinine Ratio, Random Urine   Lipid Panel   Hemoglobin A1c   Comprehensive Metabolic Panel    Plan   I do not see any indication for imaging at this time.  It looks like a musculoskeletal strain.  Offered steroid injection intramuscularly but patient declined.  States she had it in the past and it did not help.   Medications and instructions as noted below as prescribed below.  Patient Instructions  For back pain-  You may ambulate as tolerated.   I do not recommend bedrest.  Please avoid lifting > 10 lbs, no pushing or pulling of > 20 lbs of force, no bending, no stooping, and no squatting or climbing for 7 days  Apply ice/heat as directed to the affected area for 10 minutes 3-4 times a day.  You may use a back brace as needed.  Once you finish prednisone, you may start taking You may take extra strength Tylenol  or over-the-counter ibuprofen 600 mg with food-3-4 times a day (every6-8 hours)or over-the-counter Aleve  2 tablets with food up to 2 times a day as needed for pain.   Do not take over-the-counter or prescription ibuprofen, Aleve , Aspirin or any other NSAIDs or Cox 2 inhibitors like Celebrex, meloxicam while taking prednisone.  Tylenol  is all  you can take for pain.  You may take Tylenol  [phone] mg up to 4 times a day as needed for pain   You can try over-the-counter ICY HOT patches that contain lidocaine and menthol for pain relief.   Please do not take Methocarbamol and drive, operate machinery or participate in any activity that requires prolonged concentration or focusing as this medication can cause drowsiness/sedation, may impair your judgment which could lead to accidents.   Please follow-up immediately at the office or the urgent care clinic/ER for worsening back pain, numbness or tingling to extremities, change to bowel movements or bladder function.       Follow up in 9 months (on 03/12/2020), or if symptoms worsen or fail to improve, for AWV, Follow up with PCP.      Patient's Medications       Accurate as  of June 11, 2019  4:58 PM. Reflects encounter med changes as of last refresh        New Prescriptions     Instructions  methocarbamol 500 MG tablet Commonly known as: ROBAXIN Started by: Joylin G Dsa, MD  500 mg, Oral, 3 times a day as needed   predniSONE 10 mg tablet Commonly known as: DELTASONE Started by: Joylin G Dsa, MD  Take with meals-5 tabs daily for 2 days,4 tabs daily for 2 days,3 tabs daily for 2 days,2 tabs daily for 2 days,1 tab daily for 2 days &STOP      Continued Medications     Instructions  atorvastatin  20 mg tablet Commonly known as: LIPITOR  20 mg, Oral, Daily      Discontinued Medications   ADVIL 200 MG Caps Generic drug: Ibuprofen Stopped by: Joylin G Dsa, MD         Patient verbalized understanding of plan, agrees and had no unanswered questions at the end of this visit.    Electronically signed by Joylin G Dsa, MD 06/11/2019 4:58 PM    Note: Voice-recognition software was used in the creation of this documentation. Unintended transcription errors may have escaped editorial review.

## 2019-06-21 NOTE — ED Notes (Signed)
 Creat 0.7

## 2019-06-21 NOTE — ED Provider Notes (Addendum)
 [facility]  ED Provider Note  Lyndzie Zentz 69 y.o. female DOB: May 28, 1950 MRN: [id number] History   Chief Complaint  Patient presents with  . Abdominal Pain    reports pain to right side since Thursday, states has had vomiting since last night    History provided by:  Patient Abdominal Pain Pain location:  RLQ and R flank Pain quality:  Aching Pain radiates to:  Does not radiate Onset quality:  Gradual Timing:  Constant Progression:  Worsening Chronicity:  New Relieved by:  Nothing Worsened by:  Nothing Associated symptoms: nausea and vomiting   Associated symptoms: no chest pain, no chills, no constipation, no cough, no diarrhea, no dysuria, no fatigue, no fever, no hematemesis, no hematochezia, no hematuria and no shortness of breath       Past Medical History:  Diagnosis Date  . Cataracts, bilateral   . Hyperlipidemia     Past Surgical History:  Procedure Laterality Date  . Eye surgery Right 05/15/2017   CATARACT W/ IOL IMPLANT  . Eye surgery Bilateral    CATARACT by Dr Gennie  . Tubal ligation      Social History   Substance and Sexual Activity  Alcohol Use No   Social History   Tobacco Use  Smoking Status Current Every Day Smoker  . Packs/day: 0.50  Smokeless Tobacco Never Used   E-Cigarettes  . Vaping Use Never User   . Start Date    . Cartridges/Day    . Quit Date     Social History   Substance and Sexual Activity  Drug Use No   Tetanus up to date?: Yes Immunizations Up to Date?: Yes   No Known Allergies  Discharge Medication List as of 06/21/2019  9:54 AM    CONTINUE these medications which have NOT CHANGED   Details  atorvastatin  (LIPITOR) 20 mg tablet Take one tablet (20 mg dose) by mouth daily., Starting Tue 03/11/2019, Until Wed 03/10/2020, Normal    methocarbamol (ROBAXIN) 500 MG tablet Take one tablet (500 mg dose) by mouth 3 (three) times a day as needed (Back pain)., Starting Wed  06/11/2019, Normal    !! predniSONE (DELTASONE) 10 mg tablet Take with meals-5 tabs daily for 2 days,4 tabs daily for 2 days,3 tabs daily for 2 days,2 tabs daily for 2 days,1 tab daily for 2 days &STOP, Normal     !! - Potential duplicate medications found. Please discuss with provider.      Review of Systems   Review of Systems  Constitutional: Negative for chills, fatigue and fever.  Respiratory: Negative for cough and shortness of breath.   Cardiovascular: Negative for chest pain.  Gastrointestinal: Positive for abdominal pain, nausea and vomiting. Negative for constipation, diarrhea, hematemesis and hematochezia.  Genitourinary: Negative for dysuria and hematuria.  All other systems reviewed and are negative.   Physical Exam   ED Triage Vitals [06/21/19 0735]  BP (!) 186/88  Heart Rate 76  Resp 18  SpO2 97 %  Temp 98.5 F (36.9 C)    Physical Exam Nursing note and vitals reviewed.  Physical Exam Constitutional: Patient is in no acute distress Head: Normocephalic and atraumatic. Eyes: Extraocular motion intact, no scleral icterus Neck: Supple without meningismus, mass, or overt JVD Respiratory: Effort normal and breath sounds normal. No respiratory distress. CV: heart: normal rate and rhythm, no obvious murmurs.  Pulses +2 and symmetric Abdomen: Soft, non-tender, non-distended MSK: Extremities are atraumatic without deformity, ROM intact, no edema Skin:  Warm, dry, intact Neuro: Alert and oriented, no motor deficit, no sensory deficit noted. CN tested normal. Psychiatric: Mood and affect are normal.  ED Course   Lab results:    CBC AND DIFFERENTIAL - Abnormal      Result Value   WBC 11.5 (*)    RBC 5.95 (*)    HGB 18.0 (*)    HCT 54.9 (*)    MCV 92     MCH 30.3     MCHC 32.8     Plt Ct 304     RDW SD 44.7     MPV 8.5 (*)    NRBC% 0.0     NRBC 0.000     NEUTROPHIL % 80.9 (*)    LYMPHOCYTE % 11.6 (*)    MONOCYTE % 5.8     Eosinophil % 0.1 (*)     BASOPHIL % 0.5     IG% 1.100 (*)    ABSOLUTE NEUTROPHIL COUNT 9.26 (*)    ABSOLUTE LYMPHOCYTE COUNT 1.3     MONO ABSOLUTE 0.7     EOS ABSOLUTE 0.0     BASO ABSOLUTE 0.1     IG ABSOLUTE 0.130 (*)    Narrative:    Checked for clot. Full tube. Reviewed slide.  COMPREHENSIVE METABOLIC PANEL - Abnormal   Na 137     Potassium 4.5     Cl 100     CO2 24     Glucose 162 (*)    BUN 8     Creatinine 0.79     Ca 9.6     ALK PHOS 98     T Bili 0.41     Total Protein 8.2     Alb 4.8     GLOBULIN 3.4     ALBUMIN/GLOBULIN RATIO 1.4     BUN/CREAT RATIO 10.1 (*)    ALT 39     AST 17     GFR AFRICAN AMERICAN 89     Comment: African-American:  Normal GFR (glomerular filtration rate) > 60 mL/min/1.73 meters squared. < 60 may include impaired kidney function based on creatinine, age, legal sex, and race normalized to accepted average body surface area    GFR Non African American 65     Comment: Non African American:  Normal GFR (glomerular filtration rate) > 60 mL/min/1.73 meters squared. < 60 may include impaired kidney function based on creatinine, age, legal sex, and race normalized to accepted average body surface area.    AGAP 13    UA NEGATIVE POPULATION, POS SYMPTOMS - Abnormal   Urine Color Yellow     Urine Appearance Clear     Urine Specific Gravity 1.017     Urine pH 5.5     Urine Protein - Dipstick 100  (*)    Urine Glucose Negative     Urine Ketones Negative     Urine Bilirubin Negative     Urine Blood 0.2 (*)    Urine Nitrite Negative     Urine Urobilinogen <2      Urine Leukocyte Esterase 25  (*)    Urine Squamous Epithelial Cells 0      Urine WBC 3-5 (*)    Urine RBC 0-2     Urine Bacteria 2+  (*)    Comment: Rods Present    Urine Mucous Trace     UA EPITHELIALS MANUAL 0-2    LIPASE - Normal   Lipase 26    CULTURE, URINE  POCT ISTAT  GENERIC PANEL   POC CREAT 0.7     INSTRUMENT ID [id number]     OPERATOR ID [id number]    POCT ISTAT CREATININE    Imaging:   CT  ABDOMEN PELVIS W IV CONTRAST   Narrative:    INDICATION: abd pain, RLQ. COMPARISON:  None.   TECHNIQUE:  CT ABDOMEN PELVIS W IV CONTRAST - 80 mL Isovue 370 were administered intravenously  Radiation dose reduction was utilized (automated exposure control, mA or kV adjustment based on patient size, or iterative image reconstruction).  FINDINGS:   VISUALIZED LOWER THORAX: Bibasilar atelectasis. Left lower lobe 9 mm nodule (image 9).  SOLID VISCERA: Liver: No focal liver lesion or biliary dilatation. Mildly enlarged liver measuring up to 23 cm in length. Gallbladder: No calcified stones or wall thickening Pancreas: No mass or inflammation Adrenal glands: Normal. Spleen: Normal. Kidneys: Inferior left renal cyst with internal debris measuring 18 mm. Both kidneys are otherwise unremarkable without stones or hydronephrosis.  GI: No bowel obstruction. No focal bowel wall thickening or inflammatory changes. Mild fecal retention. Normal appendix.   PERITONEAL CAVITY/RETROPERITONEUM: No free fluid. No pneumoperitoneum. Enlarged porta hepatis lymph node measuring 21 x 30 mm. Atherosclerotic calcifications of the aorta. Normal caliber IVC.  PELVIS: Pelvic floor laxity. Low-density changes in the endometrium, atypical for patient's age.  MUSCULOSKELETAL: No acute or destructive osseous processes.  MISC: N/A.    Impression:    IMPRESSION: 1.  No acute finding to explain etiology of patient's pain. Normal appendix. 2.  Complicated left renal cyst, recommend six-month follow-up. 3.  Left lower lobe noncalcified nodule, see below for follow-up recommendations. 4.  Low-density changes in the endometrial canal, atypical for patient's age. Recommend correlation with pelvic ultrasound.    Excerpt from Guidelines for Management of Incidental Pulmonary Nodules Detected on CT Images: From the Fleischner Society 2017  Radiology 2017.  Note: Guidelines do not apply to lung cancer screening,  patients with immunosuppression, or patients with known primary cancer.  Nodule 9mm or greater: Low Risk: Consider CT at 3 months, PET/CT, or tissue sampling. High Risk: Consider CT at 3 months, PET/CT, or tissue sampling.  Electronically Signed by: Sonny Appl   ECG: ECG Results   None     Pre-Sedation Procedures    MDM Number of Diagnoses or Management Options Acute UTI Elevated hemoglobin (*) Pulmonary nodule Renal cyst, left Right lower quadrant abdominal pain Diagnosis management comments:   R flank and RLQ pain. ddx appy vs stone.    CT scan negative except for incidental findings which she was advised to follow-up with.  Blood work looks unremarkable except for an elevated hemoglobin.  She was warned of possible polycythemia and will follow up with her doctor.  Her flank pain is likely a combination of a urinary tract infection as well as sciatica.  She had relief with medications in the ER and felt suitable for discharge home.  She was ambulatory with ease.  The [place] Controlled Substances [place] System [place] was reviewed for the patient's 75-month history before issuing an initial prescription of controlled substances. No significant findings precluding a short term prescription were found.   I estimate there is LOW risk for ACUTE APPENDICITIS, BOWEL OBSTRUCTION, CHOLECYSTITIS, DIVERTICULITIS, INCARCERATED HERNIA, MESENTERIC ISCHEMIA, PANCREATITIS, or PERFORATED BOWEL or ULCER, thus I consider the discharge disposition reasonable. Also, there is no evidence or peritonitis, sepsis, or toxicity. We have discussed the diagnosis and risks, and we agree with discharging home to follow-up with their primary doctor. We  also discussed returning to the Emergency Department immediately if new or worsening symptoms occur. We have discussed the symptoms which are most concerning (e.g., bloody stool, fever, changing or worsening pain, vomiting) that necessitate immediate  return.  Nursing note and vitals reviewed. I have reviewed and agree with the nursing documentation on past medical history, family history, and social history.  Portions of this note were dictated using Engineer, civil (consulting) services.        Amount and/or Complexity of Data Reviewed Clinical lab tests: ordered and reviewed Tests in the radiology section of CPT: ordered and reviewed   Coding  Provider Communication  Discharge Medication List as of 06/21/2019  9:54 AM    START taking these medications   Details  cefdinir (OMNICEF) 300 mg capsule Take one capsule (300 mg dose) by mouth 2 (two) times daily for 7 days., Starting Sat 06/21/2019, Until Sat 06/28/2019, Normal    HYDROcodone-acetaminophen  (NORCO) 10-325 mg per tablet Take one tablet by mouth every 6 (six) hours as needed for Pain (may take 0.5 or 1 tab q6h prn) for up to 5 days., Starting Sat 06/21/2019, Until Thu 06/26/2019, Normal    ondansetron (ZOFRAN-ODT) 4 mg disintegrating tablet Take one tablet (4 mg dose) by mouth every 8 (eight) hours as needed for Nausea for up to 7 days., Starting Sat 06/21/2019, Until Sat 06/28/2019, Normal    !! predniSONE (DELTASONE) 10 mg tablet 6 tabs daily x2 days, 5 tabs daily x2 days, 4 tabs daily x2 days, 3 tabs daily x2 days, 2 tabs daily x2 days, 1 tablet daily x2 days, Normal     !! - Potential duplicate medications found. Please discuss with provider.      Discharge Medication List as of 06/21/2019  9:54 AM      Discharge Medication List as of 06/21/2019  9:54 AM      Clinical Impression   Final diagnoses:  Acute UTI  Renal cyst, left  Elevated hemoglobin (*)  Pulmonary nodule  Right lower quadrant abdominal pain    ED Disposition    ED Disposition Comment   Discharge               Follow-up Information    Erin K Raspet, PA-C.   Specialties: Physician Assistant, Family Medicine Contact information: [address] [place] [address] [phone]             Electronically signed by:   Alm CHRISTELLA Collier, MD 06/21/19 1047    Alm CHRISTELLA Collier, MD 06/23/19 [phone]

## 2019-06-25 DIAGNOSIS — E1129 Type 2 diabetes mellitus with other diabetic kidney complication: Secondary | ICD-10-CM | POA: Insufficient documentation

## 2019-06-25 DIAGNOSIS — E119 Type 2 diabetes mellitus without complications: Secondary | ICD-10-CM | POA: Insufficient documentation

## 2019-06-25 DIAGNOSIS — R911 Solitary pulmonary nodule: Secondary | ICD-10-CM | POA: Insufficient documentation

## 2019-06-25 DIAGNOSIS — D751 Secondary polycythemia: Secondary | ICD-10-CM | POA: Insufficient documentation

## 2019-06-25 DIAGNOSIS — Z794 Long term (current) use of insulin: Secondary | ICD-10-CM | POA: Insufficient documentation

## 2019-06-25 NOTE — Progress Notes (Signed)
 Subjective   Patient ID:  Stefanie Wilson is a 69 y.o. (DOB 1951-01-25) female.   Chief Complaint  Patient presents with  . Urinary Tract Infection    patient went to ER with lower abdominal pain, diagnosed with UTI, was given antibiotic and prednisone, patient states she feels better, just got put on bactrim  yesterday, was on cefdinir  . lung nodules  . high red blood cell count  . Cyst    right kidney     HPI  Stefanie Wilson is also here for hospital follow-up.  Care coordination note in EPIC? yes Facility name: [facility] Discharge date: 06/21/2019  Are hospital records available: Yes Main diagnoses: Acute UTI, renal cyst, elevated hemoglobin, pulmonary nodule, abdominal pain Labs done? Yes CBC showed leukocytosis with elevated hemoglobin.  CMP showed elevated glucose otherwise normal.  Lipase was normal.  UA showed evidence of infection and culture grew Enterobacter. Radiology studies done? Yes CT abdomen pelvis with contrast showed normal abdomen.  Complicated renal cyst was noted on left kidney with recommendation for 62-month follow-up.  9 mm pulmonary nodule noted in left lower lobe.  Low-density changes in endometrial canal with recommendation for pelvic ultrasound. Surgery/procedures done? No New medications: Originally prescribed cephalosporin for transition to Bactrim  based on culture Medications stopped: none Follow up with specialist recommended?  No How is patient feeling now? Good  Stefanie Wilson presents today for hospital follow-up.  She developed lower back pain that worsened prompting emergency room evaluation on 06/21/2019.  During work-up she was diagnosed with UTI and started on antibiotics.  Abdominal pelvis CT showed several abnormalities that warranted additional evaluation including complex renal cyst, 9 mm pulmonary nodule, endometrial changes.  Patient and family are concerned about these findings and interested in further investigation.  Since her  hospitalization, patient reports taking medication as prescribed without missing doses or noted side effects.  She is doing well and denies any abdominal pain, urinary symptoms, back pain, fever, nausea, vomiting.  States she has returned to her normal self and is able to perform daily activities despite symptoms.  During hospitalization patient was noted to have erythrocytosis with a hemoglobin of 18.0.  She is a current everyday smoker smoking approximately 0.5 packs/day.  Reports beginning smoking at age 22 (55 years ago) and is smoked half pack to several packs per day throughout that time.  She is open to repeat testing today to trend this level.  In addition, patient was noted to be newly diabetic based on last labs from 06/11/2019.  Hemoglobin A1c at this time was 6.6%.  She has been monitoring her diet for carbohydrates and checking her fasting blood glucose level which is consistently been under 120 (was 112 this morning).  She is not interested in going to diabetic education.  She has been seen by ophthalmologist (sees Rex Oneita) with most recent visit 05/23/2019 and no evidence of diabetic retinopathy.  She denies polyuria, polydipsia, polyphagia, symptomatic hyper or hypoglycemia.  No additional complaints or concerns today.  Reviewed and updated this visit by provider: Tobacco  Allergies  Meds  Problems  Med Hx  Surg Hx  Fam Hx        Review of Systems Review of Systems  Constitutional: Positive for appetite change. Negative for activity change, fatigue and fever.  Respiratory: Negative for cough and shortness of breath.   Cardiovascular: Negative for chest pain.  Gastrointestinal: Negative for abdominal pain (resolved), diarrhea, nausea and vomiting (resolved).  Endocrine: Negative for polydipsia, polyphagia and  polyuria.  Genitourinary: Negative for dysuria, frequency, hematuria and urgency.  Musculoskeletal: Negative for arthralgias, back pain (resolved) and myalgias.   Neurological: Positive for headaches. Negative for dizziness and light-headedness.     Objective   Vitals:   06/25/19 0805  BP: 120/70  BP Location: Right arm  Patient Position: Sitting  Pulse: 77  Temp: 97.1 F (36.2 C)  TempSrc: Temporal  SpO2: 94%  Weight: 165 lb (74.8 kg)  Height: 5' 1 (1.549 m)    Physical Exam Vitals reviewed.  Constitutional:      General: She is not in acute distress.    Appearance: Normal appearance. She is well-developed.     Comments: Very pleasant female appears stated age in no acute distress  HENT:     Head: Normocephalic and atraumatic.  Eyes:     Conjunctiva/sclera: Conjunctivae normal.  Cardiovascular:     Rate and Rhythm: Normal rate and regular rhythm.     Heart sounds: S1 normal and S2 normal. No murmur. No gallop.   Musculoskeletal:     Cervical back: Normal range of motion and neck supple.  Pulmonary:     Effort: Pulmonary effort is normal. No accessory muscle usage or respiratory distress.     Breath sounds: Normal breath sounds. No wheezing, rhonchi or rales.     Comments: Clear to auscultation bilaterally Abdominal:     General: Bowel sounds are normal. There is no distension.     Palpations: Abdomen is soft. Abdomen is not rigid.     Tenderness: There is no abdominal tenderness. There is no right CVA tenderness, left CVA tenderness, guarding or rebound.     Comments: Benign abdominal exam; no tenderness palpation.  Neurological:     Mental Status: She is alert.  Psychiatric:        Behavior: Behavior is cooperative.     --------------------------- Labs last 12 hours No results found for this or any previous visit (from the past 12 hour(s)).  Imaging last 24 hours (if applicable)  Radiology interpretation No results found.   Impression/Plan   1. Acute cystitis without hematuria (Primary) 2. Lung nodule 3. Kidney cysts 4. Erythrocytosis 5. Smoker 6. Abnormal CT scan 7. Endometrial disorder 8. Hospital  discharge follow-up -     POCT Urinalysis Dipstick -     Ambulatory Referral to Diagnostic Navigator -     Ambulatory referral to Pulmonology -     Ambulatory referral to Urology -     CBC And Differential -     US  Pelvis Transabdominal Transvaginal Without Doppler -     Comprehensive metabolic panel -     CBC And Differential  Hospital records including labs, imaging reports, provider notes reviewed.  Medication reconciliation completed during office visit today.  We discussed abnormalities noted on CT with patient and daughter in office and referrals were placed as requested.  Urgent referral placed to pulmonology for further investigation and management.  We discussed potential utility of repeat imaging in 6 months for kidney cyst, however, patient and family expressed concern that it may progress in that timeframe and so referral was placed to urology as requested.  Ultrasound was ordered to investigate endometrial abnormalities noted on CT and we will make additional recommendations based on imaging findings.  CBC and CMP drawn today-results pending.  Discussed if hemoglobin remains elevated we will consider referral to hematology.  Strict instruction to return or seek immediate medical attention with new or worsening symptoms.  She is to follow-up in  4 weeks unless needed to be seen sooner.  9. Type 2 diabetes mellitus without complication, with long-term current use of insulin (*)  Discussed that patient is now diabetic based on recent labs but well controlled and so we do not need to start medication at this time.  She is monitoring her blood sugar and encouraged her to check fasting blood sugar 2-3 times weekly and keep log for evaluation of follow-up appointment.  If blood sugar levels increase to above 120 fasting she is to contact us  at which point we will consider starting medication.  She is not interested in referral to diabetic education today.  She was encouraged to continue monitoring  her diet for carbohydrates.  We will plan to follow-up in 3 months unless needed to be seen sooner.  Documentation for time-based billing:  Total time spent of date of service was 50 minutes.  Patient care activities included preparing to see the patient such as reviewing the patient record, obtaining and/or reviewing separately obtained history, performing a medically appropriate history and physical examination, counseling and educating the patient, family, and/or caregiver, ordering prescription medications, tests, or procedures, referring and communicating with other health care providers when not separately reported during the visit, documenting clinical information in the electronic or other health record, communicating results to the patient/family/caregiver and coordinating the care of the patient when not separately reported.  See encounter after visit summary for patient specific instructions.  Follow up in about 4 weeks (around 07/23/2019) for needs 30 minutes.   Orders Placed This Encounter  Procedures  . US  Pelvis Transabdominal Transvaginal Without Doppler  . Comprehensive metabolic panel  . CBC And Differential  . Ambulatory Referral to Diagnostic Navigator  . Ambulatory referral to Urology  . Ambulatory referral to Pulmonology  . POCT Urinalysis Dipstick     Patient's Medications       Accurate as of June 25, 2019  9:31 AM. Reflects encounter med changes as of last refresh        Continued Medications     Instructions  atorvastatin  20 mg tablet Commonly known as: LIPITOR  20 mg, Oral, Daily   HYDROcodone-acetaminophen  10-325 mg per tablet Commonly known as: NORCO  1 tablet, Oral, Every 6 hours as needed   methocarbamol 500 MG tablet Commonly known as: ROBAXIN  500 mg, Oral, 3 times a day as needed   ondansetron 4 mg disintegrating tablet Commonly known as: ZOFRAN-ODT  4 mg, Oral, Every 8 hours as needed   sulfamethoxazole -trimethoprim  800-160 mg per  tablet Commonly known as: BACTRIM  DS  1 tablet, Oral, 2 times a day      Modified Medications     Instructions  predniSONE 10 mg tablet Commonly known as: DELTASONE What changed: Another medication with the same name was removed. Continue taking this medication, and follow the directions you see here. Changed by: Rocky MARLA Gilford, PA-C  Take with meals-5 tabs daily for 2 days,4 tabs daily for 2 days,3 tabs daily for 2 days,2 tabs daily for 2 days,1 tab daily for 2 days &STOP      Discontinued Medications   cefdinir 300 mg capsule Commonly known as: OMNICEF Stopped by: Erin K Raspet, PA-C       Risks, benefits, and alternatives of the medications and treatment plan prescribed today were discussed, and patient expressed understanding. Plan follow-up as discussed or as needed if any worsening symptoms or change in condition.    This documented was generated using voice recognition software. There  may be unintended transcription errors that were not detected upon document review.

## 2019-06-25 NOTE — Progress Notes (Signed)
 Thoracic Oncology Nurse Navigator encounter:   Referral received through Diagnostic Nav dashboard reviewed with Dr Helga. Pt referred to [facility] Chest/pulmology for further work up of nodule found on recent imaging (06/21/19). Jon DELENA Hedge BSN RN OCN 06/25/2019 / 12:11 PM

## 2019-07-02 NOTE — Progress Notes (Signed)
 07/02/2019  [facility] Dena Nose, PA-C   Stefanie Wilson is a 69 y.o. female seen in clinic today for a new patient visit.  Consultation at the request of Stefanie MARLA Gilford, PA-C  Thank you for allowing me to take part in Stefanie Wilson care.    Assessment    1. Kidney cysts  POCT Urinalysis Dipstick   Culture, Urine Comprehensive Urine   US  Renal    PLAN  STUDIES REVIEWED TODAY I ordered and reviewed the following studies in the office today:  UA today = negative for hematuria and UTI   I personally reviewed images from CT scan today =  Complex renal cyst      DISCUSSION   Renal US  in 6 mo  F/u with GYN for abnormal findings on CT   Has a pulmonology appt tomorrow  Follow up in about 6 months (around 01/02/2020) for with MD after renal US  .  All of the patient's/family's questions were answered in the office today Risks, benefits, and alternatives of the medications and treatment plan prescribed today were discussed, and patient expressed understanding. Plan follow-up as discussed or as needed if any worsening symptoms or change in condition.     Subjective   Patient ID:  Stefanie Wilson is a 68 y.o. (DOB Jul 27, 1950) female  has a past medical history of Cataracts, bilateral and Hyperlipidemia.    UROLOGICAL PROBLEM =    Stefanie Wilson is a 69 y.o. (DOB 14-May-1950) female presents with:  No chief complaint on file.   Notes reviewed in EPIC  History obtained from patient   [facility] - R flank pain -WBC 11.5, Hgb 18.0, Creatinine 0.79  CT abd/pelvis with contrast  IMPRESSION: 1.  No acute finding to explain etiology of patient's pain. Normal appendix. 2.  Complicated left renal cyst, recommend six-month follow-up. 3.  Left lower lobe noncalcified nodule, see below for follow-up recommendations. 4.  Low-density changes in the endometrial canal, atypical for patient's age. Recommend correlation with pelvic ultrasound  Constipation -  has tried laxative and miralax.  Has had RLQ pain, better today  Does have incontinence, not new  Denies dysuria, gross hematuria, fever     Current smoker 1/2 PPD   Past UROLOGICAL Evaluation/Treatment:    Review of Systems  Constitutional: Negative for chills, diaphoresis, fever, malaise/fatigue and weight loss.  HENT: Positive for hearing loss. Negative for congestion, ear discharge, ear pain, nosebleeds, sinus pain, sore throat and tinnitus.   Eyes: Negative for blurred vision, double vision, photophobia, pain, discharge and redness.  Respiratory: Negative for cough, hemoptysis, sputum production, shortness of breath, wheezing and stridor.   Cardiovascular: Negative for chest pain, palpitations, orthopnea, claudication, leg swelling and PND.  Gastrointestinal: Positive for abdominal pain, constipation, nausea and vomiting. Negative for blood in stool, diarrhea, heartburn and melena.  Genitourinary: Negative for dysuria, flank pain, frequency, hematuria and urgency.  Musculoskeletal: Positive for back pain. Negative for falls, joint pain, myalgias and neck pain.  Skin: Negative for itching and rash.  Neurological: Negative for dizziness, tingling, tremors, sensory change, speech change, focal weakness, seizures, loss of consciousness, weakness and headaches.  Endo/Heme/Allergies: Negative for environmental allergies and polydipsia. Does not bruise/bleed easily.  Psychiatric/Behavioral: Negative for depression, hallucinations, memory loss, substance abuse and suicidal ideas. The patient is not nervous/anxious and does not have insomnia.         Objective   Vitals:   07/02/19 1414  BP: 115/76  Patient Position: Sitting  Pulse: 71  Temp: 97.5 F (36.4 C)  TempSrc: Oral  Height: 5' 1 (1.549 m)  Weight: 165 lb (74.8 kg)  BMI (Calculated): 31.2  PainSc: 0-No pain    Physical Exam Vitals reviewed.  Constitutional:      Appearance: She is well-developed.  HENT:      Head: Normocephalic and atraumatic.     Right Ear: External ear normal.     Left Ear: External ear normal.  Eyes:     Conjunctiva/sclera: Conjunctivae normal.  Cardiovascular:     Comments: No evident cyanosis  Musculoskeletal:     Cervical back: Normal range of motion.     Comments: ROM grossly intact   Pulmonary:     Effort: Pulmonary effort is normal.  Abdominal:     General: There is no distension.  Skin:    General: Skin is warm and dry.  Neurological:     Mental Status: She is alert and oriented to person, place, and time.  Psychiatric:        Behavior: Behavior normal.        Thought Content: Thought content normal.       LABS   Recent Results (from the past 168 hour(s))  POCT Urinalysis Dipstick   Collection Time: 06/25/19  4:00 PM  Result Value Ref Range   Color Orange (A) Yellow, Straw, Dark yellow, Colorless, Bright yellow, Xantho, Comment, Pale Yellow   Clarity Clear Clear, Slightly hazy   Glucose,Urine Negative Negative mg/dL   Bilirubin Negative Negative   Ketones,Urine Negative Negative mg/dL   Specific Gravity >=8.969 (A) 1.005 - 1.030   Blood Negative Negative   pH 5.5 5.0 - 9.0   Protein Negative Negative mg/dL   Urobilinogen 1.0 0.2, 1.0 EU/dL   Nitrite Negative Negative   Leukocyte Esterase Negative Negative  POCT Urinalysis Dipstick   Collection Time: 07/02/19  2:24 PM  Result Value Ref Range   Color Yellow Yellow, Straw, Dark yellow, Colorless, Bright yellow, Xantho, Comment, Pale Yellow   Clarity Clear Clear, Slightly hazy   Glucose,Urine Negative Negative mg/dL   Bilirubin Negative Negative   Ketones,Urine Negative Negative mg/dL   Specific Gravity 8.989 1.005 - 1.030   Blood Negative Negative   pH 6.0 5.0 - 9.0   Protein Negative Negative mg/dL   Urobilinogen 0.2 0.2, 1.0 EU/dL   Nitrite Negative Negative   Leukocyte Esterase Negative Negative    ORDERS  I discussed the side effects and proper usage for the following urological  medications: Requested Prescriptions    No prescriptions requested or ordered in this encounter      Computerized orders entered in office today: Orders Placed This Encounter  Procedures  . Culture, Urine Comprehensive Urine    Standing Status:   Future    Standing Expiration Date:   07/01/2020  . US  Renal    Standing Status:   Future    Standing Expiration Date:   07/01/2020  . POCT Urinalysis Dipstick      Patient's Medications       Accurate as of July 02, 2019  2:44 PM. Reflects encounter med changes as of last refresh        Continued Medications     Instructions  atorvastatin  20 mg tablet Commonly known as: LIPITOR  20 mg, Oral, Daily   methocarbamol 500 MG tablet Commonly known as: ROBAXIN  500 mg, Oral, 3 times a day as needed   predniSONE 10 mg tablet Commonly known as: DELTASONE  Take with  meals-5 tabs daily for 2 days,4 tabs daily for 2 days,3 tabs daily for 2 days,2 tabs daily for 2 days,1 tab daily for 2 days &STOP   sulfamethoxazole -trimethoprim  800-160 mg per tablet Commonly known as: BACTRIM  DS  1 tablet, Oral, 2 times a day        No Known Allergies  Outpatient Medications Marked as Taking for the 07/02/19 encounter (Office Visit) with Dena LITTIE Nose, PA  Medication Sig Dispense Refill  . atorvastatin  (LIPITOR) 20 mg tablet Take one tablet (20 mg dose) by mouth daily. 90 tablet 1  . sulfamethoxazole -trimethoprim  (BACTRIM  DS) 800-160 mg per tablet Take one tablet by mouth 2 (two) times daily for 14 days. 28 tablet 0    Past Medical History:  Diagnosis Date  . Cataracts, bilateral   . Hyperlipidemia    Past Surgical History:  Procedure Laterality Date  . Eye surgery Right 05/15/2017   CATARACT W/ IOL IMPLANT  . Eye surgery Bilateral    CATARACT by Dr Gennie  . Tubal ligation     Social History   Socioeconomic History  . Marital status: Married    Spouse name: Sharolyn  . Number of children: 6  . Years of education: 60  .  Highest education level: 11th grade  Occupational History  . Occupation: Mows yards, Engineer, materials    Comment: self employed  Tobacco Use  . Smoking status: Current Every Day Smoker    Packs/day: 0.50  . Smokeless tobacco: Never Used  Substance and Sexual Activity  . Alcohol use: No  . Drug use: No  . Sexual activity: Not Currently    Partners: Male    Birth control/protection: None  Other Topics Concern  . Not on file  Social History Narrative  . Not on file   Social Determinants of Health   Financial Resource Strain: Low Risk   . Difficulty of Paying Living Expenses: Not hard at all  Food Insecurity: No Food Insecurity  . Worried About Programme researcher, broadcasting/film/video in the Last Year: Never true  . Ran Out of Food in the Last Year: Never true  Transportation Needs: No Transportation Needs  . Lack of Transportation (Medical): No  . Lack of Transportation (Non-Medical): No  Physical Activity: Sufficiently Active  . Days of Exercise per Week: 6 days  . Minutes of Exercise per Session: 120 min  Stress: No Stress Concern Present  . Feeling of Stress : Not at all  Social Connections: Slightly Isolated  . Frequency of Communication with Friends and Family: More than three times a week  . Frequency of Social Gatherings with Friends and Family: Once a week  . Attends Religious Services: More than 4 times per year  . Active Member of Clubs or Organizations: No  . Attends Banker Meetings: Never  . Marital Status: Married  Catering manager Violence: Not At Risk  . Fear of Current or Ex-Partner: No  . Emotionally Abused: No  . Physically Abused: No  . Sexually Abused: No    Family History  Problem Relation Age of Onset  . Gallbladder disease Mother   . Heart attack Father 71  . Cancer Sister        bone  . COPD Daughter   . Cancer Paternal Aunt   . Dementia Maternal Grandfather   . Heart attack Paternal Grandmother   . Cancer Paternal Grandfather        bone  .  Osteoporosis Paternal Aunt     Past  Medical History, Past Surgical History, Medications, Family History, and Social History were reviewed and updated as appropriate.   Portions of the history and exam were entered using voice recognition software.  Minor syntax, contextual, and spelling errors may be related to the use of this software and were not intentional.  If corrections are necessary, please contact provider.

## 2019-07-03 NOTE — Progress Notes (Signed)
 Patient completed a PFT in office today.

## 2019-07-07 NOTE — Progress Notes (Signed)
 Primary care provider: Rocky MARLA Gilford, PA-C Referring provider:      Rocky MARLA Gilford, PA-C  Assessment    1. Lung nodule   2. Cigarette smoker    69 year old female, active smoker with diabetes presents for evaluation regarding the new incidental 9 mm left lower lobe lung nodule.  This was seen on a CT scan of the abdomen and pelvis and she will return after a complete CT scan of her lungs.   Plan   Stop smoking  CT chest non contrast She has not been able to stop in the past. We did discuss options including nicotine patch or gum, chantix. She declines at this time Annual influenza vaccination if no contraindications and pneumonia vaccination per current guidelines.   The CDC recommends that you wear a mask or face covering to reduce COVID-19 exposure in public areas.  Wash your hands with soap and warm water for at least 20 seconds.  Clean and sanitize common household surfaces.  Practice social distancing.  Call your doctor or proceed to a testing center or emergency department if you experience symptoms such as a cough, fever, and/or shortness of breath.   We discussed the diagnosis and treatment plan in detail and the patient expressed understanding.    Thank you for allowing us  to participate in the care of this patient.    Subjective   Chief Complaint  Patient presents with  . New Patient    Referred by Rocky MARLA Gilford, PA-C for abnormal ct scan     Patient ID:  Stefanie Wilson is a 69 y.o. female, current smoker 53PPY, DM presents for consultation regarding an incidental left lower lobe 9 mm lung nodule that was seen on a CT scan of the abdomen and pelvis. No history of cancer. Her sister had breast cancer. She had a CT abd done for abdominal pain. She has several other family members that had cancer. She smokes 1/2 ppd. She denies trying chantix , nicotine gum. Her pulmonary function test does not show any obstruction.  She did not have any active  wheezing.  PULMONARY The patient is not on home oxygen therapy. She denies a history of blood clots. She denies exposure to asbestos. She denies exposure to tuberculosis.  She denies history of a positive TB skin test.  She denies a history of COPD. She denies a history of asthma. She denies a recent hospitalization. She denies a prior history of lung cancer.  There is no a prior history of lung surgery.  She denies a prior history pneumonia.  Occupational history: Self employed   SLEEP She denies a prior history of sleep apnea.She is not using cpap or bipap. She denies nonrestorative sleep. The patient denies excessive daytime sleepiness.  She does not snore. The snoring is not disruptive. She denies witnessed apneas.  She denies choking and gasping while sleeping. The patient has disturbed sleep.  She denies morning headaches. The snoring does not wake the patient up from sleep. She denies a history of restless leg syndrome.  The patient has leg movements in her sleep.  She does not act out dreams. She does not grind her teeth.   She denies sleep walking.  She does not have nightmares.    She denies sleep paralysis.  She denies hallucinations.  The patient denies cataplexy.   She does not work the second or third shift.   She goes to sleep at 11 PM and wakes up at 7 AM.  She sleeps for a total of about  4 hours. It take her greater than 30 minutes to fall asleep. She wakes up 5 time(s) during the night. She wakes up due to the following: Dog.  The patient denies nocturia.  The patient denies unexplained arousals from sleep.  She drinks 3 caffeinated beverages a day.    She denies having a prior sleep study.  The patient denies a family member with sleep apnea.   She has gained weight recently. The patient's neck size is 13 inches.  Epworth Sleep Questionnaire  How likely are you to doze off or fall asleep in the following situations?  Sitting and reading: 1 = slight  chance of dozing Watching TV: 1 = slight chance of dozing Sitting inactive in a public place (such as a theater or a meeting):  0 = no chance of dozing As a passenger in a car for an hour without a break: 0 = no chance of dozing Lying down to rest in the afternoon when circumstances permit: 0 = no chance of dozing Sitting and talking to someone: 0 = no chance of dozing Sitting quiety after a lunch without alcohol: 0 = no chance of dozing In a car, while stopped for a few minutes in traffic: 0 = no chance of dozing  Total Epworth: 2 Review of Systems  Constitutional: Negative.   HENT: Positive for ear discharge and hearing loss.   Eyes: Negative.   Respiratory: Positive for cough.   Cardiovascular: Negative.   Gastrointestinal: Positive for constipation and diarrhea.  Genitourinary: Negative.   Musculoskeletal: Positive for back pain.  Skin: Negative.   Neurological: Negative.   Endo/Heme/Allergies: Bruises/bleeds easily.  Psychiatric/Behavioral: Negative.   All other systems reviewed and are negative.   History   Past Medical History:  Diagnosis Date  . Cataracts, bilateral   . Hyperlipidemia    Past Surgical History:  Procedure Laterality Date  . Eye surgery Right 05/15/2017   CATARACT W/ IOL IMPLANT  . Eye surgery Bilateral    CATARACT by Dr Gennie  . Tubal ligation     Social History   Socioeconomic History  . Marital status: Married    Spouse name: Sharolyn  . Number of children: 6  . Years of education: 23  . Highest education level: 11th grade  Occupational History  . Occupation: Mows yards, Engineer, materials    Comment: self employed  Tobacco Use  . Smoking status: Current Every Day Smoker    Packs/day: 1.00    Years: 53.00    Pack years: 53.00    Start date: 1968  . Smokeless tobacco: Never Used  Substance and Sexual Activity  . Alcohol use: No  . Drug use: No  . Sexual activity: Not Currently    Partners: Male    Birth control/protection: None   Other Topics Concern  . Not on file  Social History Narrative  . Not on file   Social Determinants of Health   Financial Resource Strain: Low Risk   . Difficulty of Paying Living Expenses: Not hard at all  Food Insecurity: No Food Insecurity  . Worried About Programme researcher, broadcasting/film/video in the Last Year: Never true  . Ran Out of Food in the Last Year: Never true  Transportation Needs: No Transportation Needs  . Lack of Transportation (Medical): No  . Lack of Transportation (Non-Medical): No  Physical Activity: Sufficiently Active  . Days of Exercise per Week: 6 days  . Minutes of Exercise per  Session: 120 min  Stress: No Stress Concern Present  . Feeling of Stress : Not at all  Social Connections: Slightly Isolated  . Frequency of Communication with Friends and Family: More than three times a week  . Frequency of Social Gatherings with Friends and Family: Once a week  . Attends Religious Services: More than 4 times per year  . Active Member of Clubs or Organizations: No  . Attends Banker Meetings: Never  . Marital Status: Married  Catering manager Violence: Not At Risk  . Fear of Current or Ex-Partner: No  . Emotionally Abused: No  . Physically Abused: No  . Sexually Abused: No   Family History  Problem Relation Age of Onset  . Gallbladder disease Mother   . Heart attack Father 64  . Cancer Sister        bone  . COPD Daughter   . Cancer Paternal Aunt   . Dementia Maternal Grandfather   . Heart attack Paternal Grandmother   . Cancer Paternal Grandfather        bone  . Osteoporosis Paternal Aunt     Medication History      Medication Sig Dispense Refill  . atorvastatin  (LIPITOR) 20 mg tablet Take one tablet (20 mg dose) by mouth daily. 90 tablet 1  . methocarbamol (ROBAXIN) 500 MG tablet Take one tablet (500 mg dose) by mouth 3 (three) times a day as needed (Back pain). 30 tablet 0  . predniSONE (DELTASONE) 10 mg tablet Take with meals-5 tabs daily for 2  days,4 tabs daily for 2 days,3 tabs daily for 2 days,2 tabs daily for 2 days,1 tab daily for 2 days &STOP 30 tablet 0  . sulfamethoxazole -trimethoprim  (BACTRIM  DS) 800-160 mg per tablet Take one tablet by mouth 2 (two) times daily for 14 days. 28 tablet 0   No current facility-administered medications for this visit.   No Known Allergies    I reviewed the patient's medical,surgical,social and family history. The medications and allergies have been reviewed and updated.   Pulmonary Function Testing/Spirometry  US  Pelvis Transabdominal Transvaginal Without Doppler  Result Date: 07/04/2019 TECHNIQUE: Real-time, multiplanar, grayscale and color Doppler ultrasound of the pelvis was performed via transabdominal and transvaginal  approach. COMPARISON: CT 06/21/2019 CLINICAL INDICATION: Abnormal low density seen within the endometrial canal on CT FINDINGS: Uterus: The uterus is normal in size measuring 6.3 x 2 x 4.5 cm. The endometrial stripe is normal in thickness measuring 0.5 cm. There is a small amount of endometrial fluid. There is no free pelvic fluid. Unremarkable cervix. The ovaries are not visualized.   IMPRESSION: There is a small amount of endometrial fluid. Endometrial thickness is normal. The ovaries are not visualized. Electronically Signed by: [facility]   CXR:  No results found for this or any previous visit.   CT Scan: No results found for this or any previous visit.  No results found for this or any previous visit.  No results found for this or any previous visit.  No results found for this or any previous visit.  No results found for this or any previous visit.  No results found for this or any previous visit.  No results found for this or any previous visit.  No results found for this or any previous visit.  VQ Scan: No results found for this or any previous visit.  TTE:   No results found for this or any previous visit.  Objective  BP 120/78  (BP  Location: Left arm, Patient Position: Sitting)   Pulse 93   Temp 97.1 F (36.2 C)   Ht 5' 1 (1.549 m)   Wt 162 lb (73.5 kg)   SpO2 97%   BMI 30.61 kg/m   General appearance:  alert Head:    normocephalic Eyes:    pupils are equal, round and reactive Nose:     normal Mouth:    moist, no thrush, mallampati 4 Neck:    supple, no significant adenopathy, no thyromegaly, no JVD Chest:    decreased air entry noted bl Heart:    normal rate, regular rhythm, normal S1, S2, no murmurs, rubs, clicks or gallops Abdomen:   soft, nontender, nondistended Neurological:   alert, oriented Extremities:   peripheral pulses normal,no pitting edema , no clubbing or cyanosis  The patient was given a copy of their after visit summary.  Voice-recognition software was used in [employer] of this documentation. Unintended transcription errors may have escaped editorial review.   Orders Placed This Encounter  Procedures  . CT Chest WO IV Contrast   Immunizations    No immunizations on file.        Patient's Medications       Accurate as of July 07, 2019 11:56 AM. Reflects encounter med changes as of last refresh        Continued Medications     Instructions  atorvastatin  20 mg tablet Commonly known as: LIPITOR  20 mg, Oral, Daily   methocarbamol 500 MG tablet Commonly known as: ROBAXIN  500 mg, Oral, 3 times a day as needed   predniSONE 10 mg tablet Commonly known as: DELTASONE  Take with meals-5 tabs daily for 2 days,4 tabs daily for 2 days,3 tabs daily for 2 days,2 tabs daily for 2 days,1 tab daily for 2 days &STOP   sulfamethoxazole -trimethoprim  800-160 mg per tablet Commonly known as: BACTRIM  DS  1 tablet, Oral, 2 times a day

## 2019-07-07 NOTE — Progress Notes (Signed)
 Urine culture is negative.  Return as previously discussed, sooner if needed   Please mail letter notifying patient of results if unable to view on MyChart

## 2020-08-04 LAB — HEMOGLOBIN A1C: Hemoglobin A1C: 6.5

## 2020-11-22 ENCOUNTER — Other Ambulatory Visit: Payer: Self-pay

## 2020-11-22 ENCOUNTER — Other Ambulatory Visit: Payer: Self-pay | Admitting: Medical-Surgical

## 2020-11-22 ENCOUNTER — Ambulatory Visit (INDEPENDENT_AMBULATORY_CARE_PROVIDER_SITE_OTHER): Payer: Medicare HMO | Admitting: Medical-Surgical

## 2020-11-22 ENCOUNTER — Encounter: Payer: Self-pay | Admitting: Medical-Surgical

## 2020-11-22 VITALS — BP 145/87 | HR 71 | Resp 20 | Ht 62.0 in | Wt 166.0 lb

## 2020-11-22 DIAGNOSIS — R911 Solitary pulmonary nodule: Secondary | ICD-10-CM

## 2020-11-22 DIAGNOSIS — E119 Type 2 diabetes mellitus without complications: Secondary | ICD-10-CM

## 2020-11-22 DIAGNOSIS — D751 Secondary polycythemia: Secondary | ICD-10-CM

## 2020-11-22 DIAGNOSIS — Z7689 Persons encountering health services in other specified circumstances: Secondary | ICD-10-CM | POA: Diagnosis not present

## 2020-11-22 DIAGNOSIS — G2581 Restless legs syndrome: Secondary | ICD-10-CM

## 2020-11-22 DIAGNOSIS — Z794 Long term (current) use of insulin: Secondary | ICD-10-CM

## 2020-11-22 DIAGNOSIS — R030 Elevated blood-pressure reading, without diagnosis of hypertension: Secondary | ICD-10-CM | POA: Diagnosis not present

## 2020-11-22 DIAGNOSIS — E782 Mixed hyperlipidemia: Secondary | ICD-10-CM

## 2020-11-22 DIAGNOSIS — Z974 Presence of external hearing-aid: Secondary | ICD-10-CM

## 2020-11-22 MED ORDER — CIPROFLOXACIN-DEXAMETHASONE 0.3-0.1 % OT SUSP: 4.0000 [drp] | Freq: Two times a day (BID) | OTIC | 1 refills | Status: DC

## 2020-11-22 MED ORDER — ROPINIROLE HCL 0.25 MG PO TABS: ORAL_TABLET | ORAL | 0 refills | Status: DC

## 2020-11-22 NOTE — Telephone Encounter (Signed)
Please see pharmacy's note. Pt's daughter called and stated the medication was $95 and would like an alternative.

## 2020-11-22 NOTE — Telephone Encounter (Signed)
Ciprodex discontinued.  Sending in Cortisporin.  This is a 4 times daily eardrop.  Since she uses it on a as needed basis, I have indicated that on the label.  If she starts using it, she will need to use it for at least 5 days in a row to clear up any issue that she may have.  If she finds herself using this more than a couple of times every few months, we may need to get her in with ENT for further evaluation.  Thayer Ohm, DNP, APRN, FNP-BC [facility]

## 2020-11-22 NOTE — Progress Notes (Signed)
New Patient Office Visit  Subjective:  Patient ID: Stefanie Wilson, female    DOB: 1951-01-26  Age: 70 y.o. MRN: [id number]  CC:  Chief Complaint  Patient presents with   Establish Care    HPI Stefanie Wilson presents to establish care.  She is a pleasant 70 year old female who is companied by her daughter, Stefanie Wilson, today.   Restless leg- having difficulty with restless legs. Notes that her legs are jumpy all the time, day and night. Having difficulty sleeping due to her symptoms. Was recently started on Gabapentin but this has not helped, may have made it worse. Was originally prescribed Pramipexole but the side effects of this medication worried her so she did not take it.   DM- Hemoglobin A1c in April was 6.5%. No medications and not checking glucose at home. Avoids potatoes, pasta, and most breads.   Chronic hearing aids with ear irritation/drainage at times. Uses mometasone drops with good relief but ran out. Got an antibiotic/pain relief drop from [facility] and has been using this. Only uses it when the drainage and itching start in her ears.   Tobacco user- current cigarette smoker for 57 years. Not interested in quitting at this point. Has a follow up with pulmonology in a couple of months to check on the lung nodule that was found. Has chronic erythrocytosis.   History reviewed. No pertinent past medical history.  History reviewed. No pertinent surgical history.  Family History  Problem Relation Age of Onset   Heart attack Father    Cancer Sister        Breast    Social History   Socioeconomic History   Marital status: Married    Spouse name: Not on file   Number of children: Not on file   Years of education: Not on file   Highest education level: Not on file  Occupational History   Not on file  Tobacco Use   Smoking status: Every Day    Packs/day: 0.50    Years: 50.00    Pack years: 25.00    Types: Cigarettes   Smokeless tobacco: Never  Substance and Sexual  Activity   Alcohol use: No   Drug use: No   Sexual activity: Not on file  Other Topics Concern   Not on file  Social History Narrative   Not on file   Social Determinants of Health   Financial Resource Strain: Not on file  Food Insecurity: Not on file  Transportation Needs: Not on file  Physical Activity: Not on file  Stress: Not on file  Social Connections: Not on file  Intimate Partner Violence: Not on file    ROS Review of Systems  HENT:  Positive for ear discharge (Intermittent), ear pain (Intermittent) and hearing loss (bilateral hearing aids).   Respiratory:  Negative for cough, chest tightness, shortness of breath and wheezing.   Cardiovascular:  Negative for chest pain, palpitations and leg swelling.  Neurological:  Negative for dizziness, light-headedness and headaches.  Psychiatric/Behavioral:  Positive for sleep disturbance. Negative for dysphoric mood, self-injury and suicidal ideas. The patient is not nervous/anxious.    Objective:   Today's Vitals: BP (!) 145/87 (BP Location: Right Arm, Patient Position: Sitting, Cuff Size: Normal)   Pulse 71   Resp 20   Ht 5\' 2"  (1.575 m)   Wt 166 lb (75.3 kg)   SpO2 95%   BMI 30.36 kg/m   Physical Exam  Assessment & Plan:   1. Encounter to establish care Reviewed available  information and discussed care concerns with patient and daughter.   2. Restless leg Discontinue Gabapentin. Start Ropinirole 0.25mg  nightly for 2 nights then increase to 0.5mg  nightly at bedtime.   3. Elevated blood pressure reading Blood pressure is a little elevated today on initial and recheck. Recommend limiting dietary sodium and getting regular activity. Monitor blood pressure at home and if consistently greater than 130/80,  will need to discuss further treatment options.   4. Type 2 diabetes mellitus without complication, with long-term current use of insulin (HCC) Well controlled with dietary modification. Continue to avoid concentrated  sweets and simple carbohydrates. Will  plan to recheck A1c in around 2 months.   5. Erythrocytosis Like related to smoking but also consider possible sleep apnea. May benefit from a home sleep study in the future.   6. Lung nodule Monitored by pulmonology.   7. Mixed hyperlipidemia Continue Atorvastatin as prescribed. Will recheck lipids in 2 months.  8. Hearing aid worn On evaluation today, she does appear to have some dry skin in  her bilateral ear canals. Consider eczema vs otitis externa for the cause of her itching and symptoms. Likely related to chronic hearing aid use. Sending Ciprodex in for her to use prn for ear symptoms.   Outpatient Encounter Medications as of 11/22/2020  Medication Sig   atorvastatin (LIPITOR) 20 MG tablet Take by mouth.   ciprofloxacin-dexamethasone (CIPRODEX) OTIC suspension Place 4 drops into both ears 2 (two) times daily for 5 days.   gabapentin (NEURONTIN) 100 MG capsule Take by mouth.   rOPINIRole (REQUIP) 0.25 MG tablet Take 1 tablet (0.25 mg total) by mouth at bedtime for 2 days, THEN 2 tablets (0.5 mg total) at bedtime for 28 days.   [DISCONTINUED] mometasone (ELOCON) 0.1 % cream Apply topically daily.   [DISCONTINUED] ciprofloxacin (CIPRO) 500 MG tablet Take 1 tablet (500 mg total) by mouth 2 (two) times daily.   [DISCONTINUED] traMADol-acetaminophen (ULTRACET) 37.5-325 MG per tablet 1 or 2 every 4-6 hours as needed for severe pain.  Caution: May cause drowsiness   No facility-administered encounter medications on file as of 11/22/2020.    Follow-up: Return in about 2 months (around 02/03/2021) for DM/HLD follow up.   Thayer Ohm, DNP, APRN, FNP-BC [facility]

## 2020-11-23 NOTE — Telephone Encounter (Signed)
Called pt's daughter and left VM regarding new medication.

## 2020-12-15 ENCOUNTER — Telehealth: Payer: Self-pay

## 2020-12-15 ENCOUNTER — Other Ambulatory Visit: Payer: Self-pay | Admitting: Medical-Surgical

## 2020-12-15 MED ORDER — ROPINIROLE HCL 1 MG PO TABS: 1.0000 mg | ORAL_TABLET | Freq: Every day | ORAL | 1 refills | Status: DC

## 2020-12-15 NOTE — Telephone Encounter (Signed)
Pt's daughter notified.

## 2020-12-15 NOTE — Telephone Encounter (Signed)
Pt's daughter called and stated that the Requip has not been working for pt's restless leg syndrome. Pt's daughter wanted to know if something else might help. Please advise, thanks.

## 2020-12-21 ENCOUNTER — Telehealth: Payer: Self-pay

## 2020-12-21 NOTE — Telephone Encounter (Signed)
LVMTRC (1st attempt)    Pt's daughter, [name] (on [place] form) called and LVM stating that [facility] pharmacy does not have a refill on file for ropinirole 1 mg. She said that [facility] has run out of the Rx, has been out for the last 3 days, but also when she did have the Rx, it did not help. She is wanting to know if there is something else that can be sent in or if Aziah needs to continue ropinirole. More info needed in regards to how pt responded to ropinirole course

## 2020-12-21 NOTE — Telephone Encounter (Signed)
LVMTRC (1st attempt)    Pt returned call from earlier but I was not able to connect with her when I returned her call

## 2020-12-22 NOTE — Telephone Encounter (Signed)
Spoke with [name] who states that Stefanie Wilson started the ropinirole as instructed but it did not help her RLS. She got the Rx for the 1 mg dose from [facility] yesterday and took one last night, but it did not help her sx. They are wanting to know if she should be switched to something else. Please advise.   When calling [name] back, we can leave the info on her VM

## 2020-12-22 NOTE — Telephone Encounter (Signed)
Pt's daughter [name] (on [place] form) aware of Joy's recommendation. She verbalized understanding for pt to take 1 mg nightly for 1 week and if no improvement to increase to 2 mg nightly. Advised if after taking 2 mg for 1 week there is no improvement, to give our office a call back. No further questions or concerns at this time.

## 2021-01-12 ENCOUNTER — Telehealth: Payer: Self-pay

## 2021-01-12 NOTE — Telephone Encounter (Signed)
Pt's daughter called and stated that the Requip has not been helping with the restless leg syndrome. They wanted to know what other options they have. Please advise, thanks.

## 2021-01-13 ENCOUNTER — Other Ambulatory Visit: Payer: Self-pay

## 2021-01-13 DIAGNOSIS — G2581 Restless legs syndrome: Secondary | ICD-10-CM

## 2021-01-13 MED ORDER — GABAPENTIN 300 MG PO CAPS: 300.0000 mg | ORAL_CAPSULE | Freq: Every day | ORAL | 1 refills | Status: DC

## 2021-01-13 NOTE — Telephone Encounter (Signed)
Talked to pt's daughter regarding medication. Pt's daughter stated they no longer had the gabapentin. Prescription sent, pt's daughter expressed understanding.

## 2021-01-13 NOTE — Telephone Encounter (Signed)
Pt's daughter states that she is not taking the Gabapentin. Pt stopped taking the gabapentin when she started Requip. Pt's daughter states that the Gabapentin didn't work.

## 2021-01-13 NOTE — Telephone Encounter (Signed)
Left VM asking for return call regarding medication.

## 2021-01-25 ENCOUNTER — Other Ambulatory Visit: Payer: Self-pay

## 2021-01-25 ENCOUNTER — Encounter: Payer: Self-pay | Admitting: Medical-Surgical

## 2021-01-25 ENCOUNTER — Ambulatory Visit (INDEPENDENT_AMBULATORY_CARE_PROVIDER_SITE_OTHER): Payer: Medicare HMO | Admitting: Medical-Surgical

## 2021-01-25 VITALS — BP 129/77 | HR 80 | Resp 20 | Ht 62.0 in | Wt 170.7 lb

## 2021-01-25 DIAGNOSIS — E119 Type 2 diabetes mellitus without complications: Secondary | ICD-10-CM

## 2021-01-25 DIAGNOSIS — D751 Secondary polycythemia: Secondary | ICD-10-CM | POA: Diagnosis not present

## 2021-01-25 DIAGNOSIS — Z2821 Immunization not carried out because of patient refusal: Secondary | ICD-10-CM

## 2021-01-25 DIAGNOSIS — Z2820 Immunization not carried out because of patient decision for unspecified reason: Secondary | ICD-10-CM

## 2021-01-25 DIAGNOSIS — E782 Mixed hyperlipidemia: Secondary | ICD-10-CM

## 2021-01-25 DIAGNOSIS — Z1211 Encounter for screening for malignant neoplasm of colon: Secondary | ICD-10-CM

## 2021-01-25 DIAGNOSIS — G2581 Restless legs syndrome: Secondary | ICD-10-CM | POA: Diagnosis not present

## 2021-01-25 DIAGNOSIS — Z794 Long term (current) use of insulin: Secondary | ICD-10-CM | POA: Diagnosis not present

## 2021-01-25 DIAGNOSIS — Z780 Asymptomatic menopausal state: Secondary | ICD-10-CM

## 2021-01-25 DIAGNOSIS — F17210 Nicotine dependence, cigarettes, uncomplicated: Secondary | ICD-10-CM

## 2021-01-25 DIAGNOSIS — L989 Disorder of the skin and subcutaneous tissue, unspecified: Secondary | ICD-10-CM

## 2021-01-25 DIAGNOSIS — Z1231 Encounter for screening mammogram for malignant neoplasm of breast: Secondary | ICD-10-CM

## 2021-01-25 LAB — POCT GLYCOSYLATED HEMOGLOBIN (HGB A1C): HbA1c, POC (controlled diabetic range): 6.3 % (ref 0.0–7.0)

## 2021-01-25 NOTE — Progress Notes (Addendum)
HPI with pertinent ROS:   CC: Restless leg syndrome, diabetes follow-up  HPI: Pleasant 70 year old female presenting for the following:  Restless leg syndrome-continues to have difficulty with her legs jumping and being restless especially at night.  Tried ropinirole at several doses which was completely ineffective.  She is now taking gabapentin 300 mg at night which she also reports is completely ineffective.  She has not noticed any improvement in her symptoms with either of these medications.  Diabetes-was unaware that she was diabetic.  Last hemoglobin A1c 6.5%.  She is not taking any medications.  She does stay very active and eats a normal diet but avoids simple carbohydrates and concentrated sweets.  Skin lesion-daughter is concerned regarding a skin lesion on the right upper chest that has been present for approximately 10 years.  History of frequent sun exposure throughout her life.  Lesion is painless, nonpruritic.  Has had some color changes recently.  Hyperlipidemia-taking Lipitor as prescribed, tolerating well without side effects.  I reviewed the past medical history, family history, social history, surgical history, and allergies today and no changes were needed.  Please see the problem list section below in epic for further details.   Physical exam:   General: Well Developed, well nourished, and in no acute distress.  Neuro: Alert and oriented x3.  HEENT: Normocephalic, atraumatic.  Skin: Warm and dry.  Dark brown 2 mm raised skin lesion approximately 0.5 cm with irregular borders on the right upper chest just over the clavicle with a small round satellite lesion just inferior and lateral to it.  Has been present for approximately 10 years. Cardiac: Regular rate and rhythm, no murmurs rubs or gallops, no lower extremity edema.  Respiratory: Clear to auscultation bilaterally. Not using accessory muscles, speaking in full sentences.  Impression and Recommendations:    1.  Type 2 diabetes mellitus without complication, with long-term current use of insulin (HCC) POCT hemoglobin A1c 6.3% today.  Checking CMP.  Doing very well with diet and lifestyle modifications to manage diabetes. - COMPLETE METABOLIC PANEL WITH GFR - POCT glycosylated hemoglobin (Hb A1C)  2. Mixed hyperlipidemia Checking lipid panel today.  Continue atorvastatin as prescribed - Lipid panel  3. Restless leg No response to ropinirole or low-dose gabapentin.  Increasing gabapentin to 600 mg.  Discussed maximum dose of gabapentin that is safe.  Also discussed expectations to manage restless legs while mitigating risks of overmedicating at her age.  If gabapentin is not helpful even at maximum dose or she is unable to tolerate higher doses, we may need to consider carbidopa/levodopa.  4. Erythrocytosis Checking CBC with differential. - CBC with Differential/Platelet  5. Cigarette smoker Checking CBC.  Encouraged smoking cessation. - CBC with Differential/Platelet  6. Influenza vaccine refused 7. Refuses tetanus, diphtheria, and acellular pertussis (Tdap) vaccination 8. Vaccine refused by patient Discussed risk versus benefits of vaccinations.  Patient still declined today.  Sending shingles vaccine prescription to her pharmacy should she decide to go forward with this 1.  9. Skin lesion of chest wall Skin lesion on right upper chest near clavicle concerning for possible melanoma with irregular borders and color changes.  Discussed the process for shave biopsy and lab evaluation.  She would like to avoid doing this today but will consider it for her next visit.  10. Encounter for screening mammogram for malignant neoplasm of breast Mammogram ordered. - MM 3D SCREEN BREAST BILATERAL; Future  11. Colon cancer screening Referring to GI for colonoscopy. - Ambulatory referral to  Gastroenterology  12. Postmenopausal DEXA scan ordered today. - DG Bone Density; Future   Return in about 6  months (around 07/26/2021) for chronic disease follow up; 2 weeks for lesion removal/RLS follow up. ___________________________________________ Thayer Ohm, DNP, APRN, FNP-BC Primary Care and Sports Medicine [facility] [place]

## 2021-01-25 NOTE — Addendum Note (Signed)
Addended byChristen Butter on: 01/25/2021 05:06 PM   Modules accepted: Orders

## 2021-01-26 ENCOUNTER — Other Ambulatory Visit: Payer: Self-pay | Admitting: Medical-Surgical

## 2021-01-26 DIAGNOSIS — G2581 Restless legs syndrome: Secondary | ICD-10-CM

## 2021-01-26 LAB — LIPID PANEL
Cholesterol: 162 mg/dL (ref <200)
HDL: 44 mg/dL — ABNORMAL LOW (ref >=50)
LDL Cholesterol (Calc): 91 mg/dL (calc)
Non-HDL Cholesterol (Calc): 118 mg/dL (calc) (ref <130)
Total CHOL/HDL Ratio: 3.7 (calc) (ref <5.0)
Triglycerides: 170 mg/dL — ABNORMAL HIGH (ref <150)

## 2021-01-26 LAB — CBC WITH DIFFERENTIAL/PLATELET
Absolute Monocytes: 670 cells/uL (ref 200–950)
Basophils Absolute: 78 cells/uL (ref 0–200)
Basophils Relative: 0.9 %
Eosinophils Absolute: 174 cells/uL (ref 15–500)
Eosinophils Relative: 2 %
HCT: 51.6 % — ABNORMAL HIGH (ref 35.0–45.0)
Hemoglobin: 17.2 g/dL — ABNORMAL HIGH (ref 11.7–15.5)
Lymphs Abs: 3184 cells/uL (ref 850–3900)
MCH: 30.7 pg (ref 27.0–33.0)
MCHC: 33.3 g/dL (ref 32.0–36.0)
MCV: 92 fL (ref 80.0–100.0)
MPV: 9.3 fL (ref 7.5–12.5)
Monocytes Relative: 7.7 %
Neutro Abs: 4594 cells/uL (ref 1500–7800)
Neutrophils Relative %: 52.8 %
Platelets: 294 10*3/uL (ref 140–400)
RBC: 5.61 10*6/uL — ABNORMAL HIGH (ref 3.80–5.10)
RDW: 12.8 % (ref 11.0–15.0)
Total Lymphocyte: 36.6 %
WBC: 8.7 10*3/uL (ref 3.8–10.8)

## 2021-01-26 LAB — COMPLETE METABOLIC PANEL WITH GFR
AG Ratio: 1.3 (calc) (ref 1.0–2.5)
ALT: 23 U/L (ref 6–29)
AST: 21 U/L (ref 10–35)
Albumin: 4 g/dL (ref 3.6–5.1)
Alkaline phosphatase (APISO): 83 U/L (ref 37–153)
BUN: 8 mg/dL (ref 7–25)
CO2: 27 mmol/L (ref 20–32)
Calcium: 9.8 mg/dL (ref 8.6–10.4)
Chloride: 107 mmol/L (ref 98–110)
Creat: 0.79 mg/dL (ref 0.60–1.00)
Globulin: 3.2 g/dL (calc) (ref 1.9–3.7)
Glucose, Bld: 121 mg/dL — ABNORMAL HIGH (ref 65–99)
Potassium: 4.7 mmol/L (ref 3.5–5.3)
Sodium: 144 mmol/L (ref 135–146)
Total Bilirubin: 0.4 mg/dL (ref 0.2–1.2)
Total Protein: 7.2 g/dL (ref 6.1–8.1)
eGFR: 80 mL/min/{1.73_m2} (ref >=60)

## 2021-01-26 MED ORDER — GABAPENTIN 300 MG PO CAPS: 600.0000 mg | ORAL_CAPSULE | Freq: Every day | ORAL | 1 refills | Status: DC

## 2021-01-26 MED ORDER — ATORVASTATIN CALCIUM 40 MG PO TABS: 40.0000 mg | ORAL_TABLET | Freq: Every day | ORAL | 1 refills | Status: DC

## 2021-01-27 ENCOUNTER — Encounter: Payer: Self-pay | Admitting: Gastroenterology

## 2021-01-30 ENCOUNTER — Other Ambulatory Visit: Payer: Self-pay | Admitting: Medical-Surgical

## 2021-02-02 ENCOUNTER — Other Ambulatory Visit: Payer: Self-pay

## 2021-02-02 DIAGNOSIS — G2581 Restless legs syndrome: Secondary | ICD-10-CM

## 2021-02-02 MED ORDER — GABAPENTIN 300 MG PO CAPS: 600.0000 mg | ORAL_CAPSULE | Freq: Two times a day (BID) | ORAL | 1 refills | Status: DC

## 2021-02-07 ENCOUNTER — Ambulatory Visit (INDEPENDENT_AMBULATORY_CARE_PROVIDER_SITE_OTHER): Payer: Medicare HMO | Admitting: Medical-Surgical

## 2021-02-07 DIAGNOSIS — Z1231 Encounter for screening mammogram for malignant neoplasm of breast: Secondary | ICD-10-CM

## 2021-02-07 DIAGNOSIS — Z780 Asymptomatic menopausal state: Secondary | ICD-10-CM

## 2021-02-07 DIAGNOSIS — Z0000 Encounter for general adult medical examination without abnormal findings: Secondary | ICD-10-CM

## 2021-02-07 NOTE — Progress Notes (Addendum)
MEDICARE ANNUAL WELLNESS VISIT  02/07/2021  Telephone Visit Disclaimer This Medicare AWV was conducted by telephone due to national recommendations for restrictions regarding the COVID-19 Pandemic (e.g. social distancing).  I verified, using two identifiers, that I am speaking with Stefanie Wilson or their authorized healthcare agent. I discussed the limitations, risks, security, and privacy concerns of performing an evaluation and management service by telephone and the potential availability of an in-person appointment in the future. The patient expressed understanding and agreed to proceed.  Location of Patient: Home Location of Provider (nurse):  In the office.  Subjective:    Stefanie Wilson is a 70 y.o. female patient of Christen Butter, NP who had a Medicare Annual Wellness Visit today via telephone. Stefanie Wilson is Retired and lives with their son. she has 6 children. she reports that she is socially active and does interact with friends/family regularly. she is minimally physically active and enjoys watching television.  Patient Care Team: Christen Butter, NP as PCP - General (Nurse Practitioner)  Advanced Directives 02/07/2021 11/22/2020  Does Patient Have a Medical Advance Directive? Yes Yes  Type of Advance Directive Living will;Healthcare Power of Attorney Living will  Does patient want to make changes to medical advance directive? No - Patient declined No - Patient declined  Copy of Healthcare Power of Attorney in Chart? No - copy requested [facility] Utilization Over the Past 12 Months: # of hospitalizations or ER visits: 0 # of surgeries: 0  Review of Systems    Patient reports that her overall health is worse compared to last year.  History obtained from chart review and the patient  Patient Reported Readings (BP, Pulse, CBG, Weight, etc) none  Pain Assessment Pain : No/denies pain     Current Medications & Allergies (verified) Allergies as of 02/07/2021   No Known  Allergies      Medication List        Accurate as of February 07, 2021  3:05 PM. If you have any questions, ask your nurse or doctor.          atorvastatin 40 MG tablet Commonly known as: LIPITOR Take 1 tablet (40 mg total) by mouth daily.   gabapentin 300 MG capsule Commonly known as: NEURONTIN Take 2 capsules (600 mg total) by mouth 2 (two) times daily.   NEOMYCIN-POLYMYXIN-HYDROCORTISONE 1 % Soln OTIC solution Commonly known as: CORTISPORIN Place 3 drops into both ears 4 (four) times daily as needed.        History (reviewed): History reviewed. No pertinent past medical history. History reviewed. No pertinent surgical history. Family History  Problem Relation Age of Onset   Heart attack Father    Cancer Sister        Breast   Social History   Socioeconomic History   Marital status: Widowed    Spouse name: Not on file   Number of children: 6   Years of education: 46   Highest education level: 11th grade  Occupational History   Occupation: Retired  Tobacco Use   Smoking status: Every Day    Packs/day: 0.50    Years: 50.00    Pack years: 25.00    Types: Cigarettes   Smokeless tobacco: Never  Substance and Sexual Activity   Alcohol use: No   Drug use: No   Sexual activity: Not on file  Other Topics Concern   Not on file  Social History Narrative   Lives with her son. She recently lost her husband (  May, 2022). She enjoys watching television in her free time.   Social Determinants of Health   Financial Resource Strain: Low Risk    Difficulty of Paying Living Expenses: Not hard at all  Food Insecurity: No Food Insecurity   Worried About Programme researcher, broadcasting/film/video in the Last Year: Never true   Ran Out of Food in the Last Year: Never true  Transportation Needs: No Transportation Needs   Lack of Transportation (Medical): No   Lack of Transportation (Non-Medical): No  Physical Activity: Inactive   Days of Exercise per Week: 0 days   Minutes of Exercise  per Session: 0 min  Stress: No Stress Concern Present   Feeling of Stress : Not at all  Social Connections: Moderately Isolated   Frequency of Communication with Friends and Family: More than three times a week   Frequency of Social Gatherings with Friends and Family: More than three times a week   Attends Religious Services: More than 4 times per year   Active Member of [employer] or Organizations: No   Attends Banker Meetings: Never   Marital Status: Widowed    Activities of Daily Living In your present state of health, do you have any difficulty performing the following activities: 02/07/2021 11/22/2020  Hearing? N Y  Comment wears bilateral hearing aids. -  Vision? N N  Difficulty concentrating or making decisions? N N  Walking or climbing stairs? N N  Dressing or bathing? N N  Doing errands, shopping? Y N  Comment She doesn't drive; her son or daughter take her. -  Preparing Food and eating ? N -  Using the Toilet? N -  In the past six months, have you accidently leaked urine? N -  Do you have problems with loss of bowel control? N -  Managing your Medications? N -  Managing your Finances? N -  Housekeeping or managing your Housekeeping? N -  Some recent data might be hidden    Patient Education/ Literacy How often do you need to have someone help you when you read instructions, pamphlets, or other written materials from your doctor or pharmacy?: 1 - Never What is the last grade level you completed in school?: 11th grade  Exercise Current Exercise Habits: The patient does not participate in regular exercise at present, Exercise limited by: None identified  Diet Patient reports consuming 2 meals a day and 0-1 snack(s) a day Patient reports that her primary diet is: Regular Patient reports that she does have regular access to food.   Depression Screen PHQ 2/9 Scores 02/07/2021 01/25/2021 11/22/2020  PHQ - 2 Score 0 0 0  PHQ- 9 Score - - 3     Fall Risk Fall  Risk  02/07/2021 01/25/2021 11/22/2020  Falls in the past year? 0 0 0  Number falls in past yr: 0 0 0  Injury with Fall? 0 0 0  Risk for fall due to : No Fall Risks - No Fall Risks  Follow up Falls evaluation completed Falls evaluation completed Falls evaluation completed     Objective:  Leara Rawl seemed alert and oriented and she participated appropriately during our telephone visit.  Blood Pressure Weight BMI  BP Readings from Last 3 Encounters:  01/25/21 129/77  11/22/20 (!) 145/87  03/18/14 118/79   Wt Readings from Last 3 Encounters:  01/25/21 170 lb 11.2 oz (77.4 kg)  11/22/20 166 lb (75.3 kg)  03/18/14 165 lb (74.8 kg)   BMI Readings from Last  1 Encounters:  01/25/21 31.22 kg/m    *Unable to obtain current vital signs, weight, and BMI due to telephone visit type  Hearing/Vision  Britta Mccreedy did not seem to have difficulty with hearing/understanding during the telephone conversation Reports that she has had a formal eye exam by an eye care professional within the past year Reports that she has had a formal hearing evaluation within the past year *Unable to fully assess hearing and vision during telephone visit type  Cognitive Function: 6CIT Screen 02/07/2021  What Year? 0 points  What month? 0 points  What time? 0 points  Count back from 20 0 points  Months in reverse 4 points  Repeat phrase 4 points  Total Score 8   (Normal:0-7, Significant for Dysfunction: >8)  Normal Cognitive Function Screening: No. She states she has always had some memory loss.   Immunization & Health Maintenance Record  There is no immunization history on file for this patient.  Health Maintenance  Topic Date Due   OPHTHALMOLOGY EXAM  02/21/2021 (Originally 10/09/1960)   Zoster Vaccines- Shingrix (1 of 2) 02/22/2021 (Originally 10/09/2000)   URINE MICROALBUMIN  03/10/2021 (Originally 10/09/1960)   INFLUENZA VACCINE  07/22/2021 (Originally 11/22/2020)   TETANUS/TDAP  11/22/2021  (Originally 10/09/1969)   DEXA SCAN  02/07/2022 (Originally 10/10/2015)   COLONOSCOPY (Pts 45-69yrs Insurance coverage will need to be confirmed)  02/07/2022 (Originally 10/10/1995)   Hepatitis C Screening  02/07/2022 (Originally 10/09/1968)   MAMMOGRAM  04/24/2021   HEMOGLOBIN A1C  07/26/2021   FOOT EXAM  01/25/2022   HPV VACCINES  Aged Out   COVID-19 Vaccine  Discontinued       Assessment  This is a routine wellness examination for [employer].  Health Maintenance: Due or Overdue There are no preventive care reminders to display for this patient.   Halea Lieb does not need a referral for [employer] Assistance: Care Management:   no Social Work:    no Prescription Assistance:  no Nutrition/Diabetes Education:  no   Plan:  Personalized Goals  Goals Addressed               This Visit's Progress     Patient Stated (pt-stated)        02/07/2021 AWV Goal: Exercise for General Health  Patient will verbalize understanding of the benefits of increased physical activity: Exercising regularly is important. It will improve your overall fitness, flexibility, and endurance. Regular exercise also will improve your overall health. It can help you control your weight, reduce stress, and improve your bone density. Over the next year, patient will increase physical activity as tolerated with a goal of at least 150 minutes of moderate physical activity per week.  You can tell that you are exercising at a moderate intensity if your heart starts beating faster and you start breathing faster but can still hold a conversation. Moderate-intensity exercise ideas include: Walking 1 mile (1.6 km) in about 15 minutes Biking Hiking Golfing Dancing Water aerobics Patient will verbalize understanding of everyday activities that increase physical activity by providing examples like the following: Yard work, such as: Company secretary Gardening Washing windows or floors Patient will be able to explain general safety guidelines for exercising:  Before you start a new exercise program, talk with your health care provider. Do not exercise so much that you hurt yourself, feel dizzy, or get very short of breath. Wear comfortable clothes and wear shoes  with good support. Drink plenty of water while you exercise to prevent dehydration or heat stroke. Work out until your breathing and your heartbeat get faster.        Personalized Health Maintenance & Screening Recommendations  Pneumococcal vaccine  Influenza vaccine Td vaccine Screening mammography Bone densitometry screening Colorectal cancer screening Shingles vaccine Eye exam- Please have your eye doctor fax the report.   Patient declined all of the vaccines at this time.   Lung Cancer Screening Recommended: yes; patient declined at this time. (Low Dose CT Chest recommended if Age 55-80 years, 30 pack-year currently smoking OR have quit w/in past 15 years) Hepatitis C Screening recommended: yes HIV Screening recommended: no  Advanced Directives: Written information was not prepared per patient's request.  Referrals & Orders Orders Placed This Encounter  Procedures   DEXAScan   Mammogram 3D SCREEN BREAST BILATERAL     Follow-up Plan Follow-up with Christen Butter, NP as planned Please let us know when you had your eye exam and have the report faxed to the office.  Please have your colonoscopy report faxed to her our office. Let us know if you change your mind about the vaccines.  Medicare wellness visit in one year.  AVS printed and mailed to the patient.   I have personally reviewed and noted the following in the patient's chart:   Medical and social history Use of alcohol, tobacco or illicit drugs  Current medications and supplements Functional ability and status Nutritional status Physical activity Advanced  directives List of other physicians Hospitalizations, surgeries, and ER visits in previous 12 months Vitals Screenings to include cognitive, depression, and falls Referrals and appointments  In addition, I have reviewed and discussed with Stefanie Wilson certain preventive protocols, quality metrics, and best practice recommendations. A written personalized care plan for preventive services as well as general preventive health recommendations is available and can be mailed to the patient at her request.      Modesto Charon, RN  02/07/2021

## 2021-02-07 NOTE — Patient Instructions (Addendum)
MEDICARE ANNUAL WELLNESS VISIT Health Maintenance Summary and Written Plan of Care  Stefanie Wilson ,  Thank you for allowing me to perform your Medicare Annual Wellness Visit and for your ongoing commitment to your health.   Health Maintenance & Immunization History Health Maintenance  Topic Date Due   OPHTHALMOLOGY EXAM  02/21/2021 (Originally 10/09/1960)   Zoster Vaccines- Shingrix (1 of 2) 02/22/2021 (Originally 10/09/2000)   URINE MICROALBUMIN  03/10/2021 (Originally 10/09/1960)   INFLUENZA VACCINE  07/22/2021 (Originally 11/22/2020)   TETANUS/TDAP  11/22/2021 (Originally 10/09/1969)   DEXA SCAN  02/07/2022 (Originally 10/10/2015)   COLONOSCOPY (Pts 45-60yrs Insurance coverage will need to be confirmed)  02/07/2022 (Originally 10/10/1995)   Hepatitis C Screening  02/07/2022 (Originally 10/09/1968)   MAMMOGRAM  04/24/2021   HEMOGLOBIN A1C  07/26/2021   FOOT EXAM  01/25/2022   HPV VACCINES  Aged Out   COVID-19 Vaccine  Discontinued    There is no immunization history on file for this patient.  These are the patient goals that we discussed:  Goals Addressed               This Visit's Progress     Patient Stated (pt-stated)        02/07/2021 AWV Goal: Exercise for General Health  Patient will verbalize understanding of the benefits of increased physical activity: Exercising regularly is important. It will improve your overall fitness, flexibility, and endurance. Regular exercise also will improve your overall health. It can help you control your weight, reduce stress, and improve your bone density. Over the next year, patient will increase physical activity as tolerated with a goal of at least 150 minutes of moderate physical activity per week.  You can tell that you are exercising at a moderate intensity if your heart starts beating faster and you start breathing faster but can still hold a conversation. Moderate-intensity exercise ideas include: Walking 1 mile (1.6 km) in about  15 minutes Biking Hiking Golfing Dancing Water aerobics Patient will verbalize understanding of everyday activities that increase physical activity by providing examples like the following: Yard work, such as: Insurance underwriter Gardening Washing windows or floors Patient will be able to explain general safety guidelines for exercising:  Before you start a new exercise program, talk with your health care provider. Do not exercise so much that you hurt yourself, feel dizzy, or get very short of breath. Wear comfortable clothes and wear shoes with good support. Drink plenty of water while you exercise to prevent dehydration or heat stroke. Work out until your breathing and your heartbeat get faster.          This is a list of Health Maintenance Items that are overdue or due now: Pneumococcal vaccine  Influenza vaccine Td vaccine Screening mammography Bone densitometry screening Colorectal cancer screening Shingles vaccine Eye exam- Please have your eye doctor fax the report.   Patient declined all of the vaccines at this time.   Orders/Referrals Placed Today: Orders Placed This Encounter  Procedures   DEXAScan    Standing Status:   Future    Standing Expiration Date:   02/07/2022    Scheduling Instructions:     Please call patient to schedule.    Order Specific Question:   Reason for exam:    Answer:   Post menopausal    Order Specific Question:   Preferred imaging location?    Answer:   [name] 3D SCREEN BREAST BILATERAL    Standing Status:   Future    Standing Expiration Date:   02/07/2022    Scheduling Instructions:     Please call patient to schedule    Order Specific Question:   Reason for Exam (SYMPTOM  OR DIAGNOSIS REQUIRED)    Answer:   breast cancer screening    Order Specific Question:   Preferred imaging location?    [facility]    (Contact our referral department at [phone] if you have not spoken with someone about your referral appointment within the next 5 days)    Follow-up Plan Follow-up with Christen Butter, NP as planned Please let us know when you had your eye exam and have the report faxed to the office.  Please have your colonoscopy report faxed to her our office. Let us know if you change your mind about the vaccines.  Medicare wellness visit in one year.  AVS printed and mailed to the patient.      Health Maintenance, Female Adopting a healthy lifestyle and getting preventive care are important in promoting health and wellness. Ask your health care provider about: The right schedule for you to have regular tests and exams. Things you can do on your own to prevent diseases and keep yourself healthy. What should I know about diet, weight, and exercise? Eat a healthy diet  Eat a diet that includes plenty of vegetables, fruits, low-fat dairy products, and lean protein. Do not eat a lot of foods that are high in solid fats, added sugars, or sodium. Maintain a healthy weight Body mass index (BMI) is used to identify weight problems. It estimates body fat based on height and weight. Your health care provider can help determine your BMI and help you achieve or maintain a healthy weight. Get regular exercise Get regular exercise. This is one of the most important things you can do for your health. Most adults should: Exercise for at least 150 minutes each week. The exercise should increase your heart rate and make you sweat (moderate-intensity exercise). Do strengthening exercises at least twice a week. This is in addition to the moderate-intensity exercise. Spend less time sitting. Even light physical activity can be beneficial. Watch cholesterol and blood lipids Have your blood tested for lipids and cholesterol at 70 years of age, then have this test every 5 years. Have your cholesterol  levels checked more often if: Your lipid or cholesterol levels are high. You are older than 70 years of age. You are at high risk for heart disease. What should I know about cancer screening? Depending on your health history and family history, you may need to have cancer screening at various ages. This may include screening for: Breast cancer. Cervical cancer. Colorectal cancer. Skin cancer. Lung cancer. What should I know about heart disease, diabetes, and high blood pressure? Blood pressure and heart disease High blood pressure causes heart disease and increases the risk of stroke. This is more likely to develop in people who have high blood pressure readings, are of African descent, or are overweight. Have your blood pressure checked: Every 3-5 years if you are 37-24 years of age. Every year if you are 49 years old or older. Diabetes Have regular diabetes screenings. This checks your fasting blood sugar level. Have the screening done: Once every three years after age 4 if you are at a normal weight and have a low risk for diabetes. More often and at a younger  age if you are overweight or have a high risk for diabetes. What should I know about preventing infection? Hepatitis B If you have a higher risk for hepatitis B, you should be screened for this virus. Talk with your health care provider to find out if you are at risk for hepatitis B infection. Hepatitis C Testing is recommended for: Everyone born from [birth year] through 1965. Anyone with known risk factors for hepatitis C. Sexually transmitted infections (STIs) Get screened for STIs, including gonorrhea and chlamydia, if: You are sexually active and are younger than 70 years of age. You are older than 70 years of age and your health care provider tells you that you are at risk for this type of infection. Your sexual activity has changed since you were last screened, and you are at increased risk for chlamydia or gonorrhea. Ask  your health care provider if you are at risk. Ask your health care provider about whether you are at high risk for HIV. Your health care provider may recommend a prescription medicine to help prevent HIV infection. If you choose to take medicine to prevent HIV, you should first get tested for HIV. You should then be tested every 3 months for as long as you are taking the medicine. Pregnancy If you are about to stop having your period (premenopausal) and you may become pregnant, seek counseling before you get pregnant. Take 400 to 800 micrograms (mcg) of folic acid every day if you become pregnant. Ask for birth control (contraception) if you want to prevent pregnancy. Osteoporosis and menopause Osteoporosis is a disease in which the bones lose minerals and strength with aging. This can result in bone fractures. If you are 42 years old or older, or if you are at risk for osteoporosis and fractures, ask your health care provider if you should: Be screened for bone loss. Take a calcium or vitamin D supplement to lower your risk of fractures. Be given hormone replacement therapy (HRT) to treat symptoms of menopause. Follow these instructions at home: Lifestyle Do not use any products that contain nicotine or tobacco, such as cigarettes, e-cigarettes, and chewing tobacco. If you need help quitting, ask your health care provider. Do not use street drugs. Do not share needles. Ask your health care provider for help if you need support or information about quitting drugs. Alcohol use Do not drink alcohol if: Your health care provider tells you not to drink. You are pregnant, may be pregnant, or are planning to become pregnant. If you drink alcohol: Limit how much you use to 0-1 drink a day. Limit intake if you are breastfeeding. Be aware of how much alcohol is in your drink. In the [place] one drink equals one 12 oz bottle of beer (355 mL), one 5 oz glass of wine (148 mL), or one 1 oz glass of hard  liquor (44 mL). General instructions Schedule regular health, dental, and eye exams. Stay current with your vaccines. Tell your health care provider if: You often feel depressed. You have ever been abused or do not feel safe at home. Summary Adopting a healthy lifestyle and getting preventive care are important in promoting health and wellness. Follow your health care provider's instructions about healthy diet, exercising, and getting tested or screened for diseases. Follow your health care provider's instructions on monitoring your cholesterol and blood pressure. This information is not intended to replace advice given to you by your health care provider. Make sure you discuss any questions you have with your  health care provider. Document Revised: 06/18/2020 Document Reviewed: 04/03/2018 Elsevier Patient Education  2022 [employer].

## 2021-02-08 ENCOUNTER — Ambulatory Visit: Payer: Medicare HMO | Admitting: Medical-Surgical

## 2021-02-08 NOTE — Progress Notes (Deleted)
  HPI with pertinent ROS:   CC:   HPI:   I reviewed the past medical history, family history, social history, surgical history, and allergies today and no changes were needed.  Please see the problem list section below in epic for further details.   Physical exam:   General: Well Developed, well nourished, and in no acute distress.  Neuro: Alert and oriented x3, extra-ocular muscles intact, sensation grossly intact.  HEENT: Normocephalic, atraumatic, pupils equal round reactive to light, neck supple, no masses, no lymphadenopathy, thyroid nonpalpable.  Skin: Warm and dry, no rashes. Cardiac: Regular rate and rhythm, no murmurs rubs or gallops, no lower extremity edema.  Respiratory: Clear to auscultation bilaterally. Not using accessory muscles, speaking in full sentences.  Impression and Recommendations:    1. Skin lesion of chest wall  2. Restless leg ***   No follow-ups on file. ___________________________________________ Thayer Ohm, DNP, APRN, FNP-BC Primary Care and Sports Medicine [facility] [place]

## 2021-03-15 ENCOUNTER — Encounter: Payer: Medicare HMO | Admitting: Gastroenterology

## 2021-03-16 ENCOUNTER — Other Ambulatory Visit: Payer: Self-pay

## 2021-03-16 ENCOUNTER — Ambulatory Visit (INDEPENDENT_AMBULATORY_CARE_PROVIDER_SITE_OTHER): Payer: Medicare HMO

## 2021-03-16 ENCOUNTER — Ambulatory Visit: Payer: Medicare HMO | Admitting: Medical-Surgical

## 2021-03-16 DIAGNOSIS — Z1231 Encounter for screening mammogram for malignant neoplasm of breast: Secondary | ICD-10-CM

## 2021-03-16 DIAGNOSIS — Z780 Asymptomatic menopausal state: Secondary | ICD-10-CM

## 2021-03-16 DIAGNOSIS — Z0000 Encounter for general adult medical examination without abnormal findings: Secondary | ICD-10-CM

## 2021-03-23 ENCOUNTER — Telehealth: Payer: Self-pay

## 2021-03-23 ENCOUNTER — Other Ambulatory Visit: Payer: Self-pay

## 2021-03-23 MED ORDER — PRAMIPEXOLE DIHYDROCHLORIDE 0.125 MG PO TABS: 0.1250 mg | ORAL_TABLET | Freq: Three times a day (TID) | ORAL | 0 refills | Status: DC

## 2021-03-23 NOTE — Telephone Encounter (Signed)
Gabapentin added to intolerance list.

## 2021-03-23 NOTE — Telephone Encounter (Signed)
Discontinue Gabapentin. Start Mirapex 0.125mg  three times daily. This dose is the smallest and the medication can be titrated up depending upon response. This can take several weeks to months to titrate so unless there are bad side effects/intolerance issues, she will need to continue the medication and give it a fair try. After this, there are very few options that are safe for a patient her age.   ___________________________________________ Thayer Ohm, DNP, APRN, FNP-BC Primary Care and Sports Medicine [facility]

## 2021-03-23 NOTE — Telephone Encounter (Signed)
Pt's daughter states the gabapentin made Stefanie Wilson "jump" all day. Pt's daughter advised Stefanie Wilson to stop the gabapentin.  Pt's daughter would like to try something else. Gabapentin added to allergy list.

## 2021-03-23 NOTE — Telephone Encounter (Signed)
Pt's daughter informed.  Daughter expressed understanding and is agreeable.  T. Kazzandra Desaulniers, CMA 

## 2021-03-28 ENCOUNTER — Ambulatory Visit: Payer: Medicare HMO | Admitting: Medical-Surgical

## 2021-04-01 ENCOUNTER — Encounter: Payer: Self-pay | Admitting: Medical-Surgical

## 2021-04-01 ENCOUNTER — Other Ambulatory Visit: Payer: Self-pay

## 2021-04-01 ENCOUNTER — Ambulatory Visit (INDEPENDENT_AMBULATORY_CARE_PROVIDER_SITE_OTHER): Payer: Medicare HMO | Admitting: Medical-Surgical

## 2021-04-01 VITALS — BP 106/63 | HR 85 | Resp 20 | Ht 62.0 in | Wt 169.9 lb

## 2021-04-01 DIAGNOSIS — L989 Disorder of the skin and subcutaneous tissue, unspecified: Secondary | ICD-10-CM

## 2021-04-01 DIAGNOSIS — G2581 Restless legs syndrome: Secondary | ICD-10-CM

## 2021-04-01 DIAGNOSIS — L821 Other seborrheic keratosis: Secondary | ICD-10-CM | POA: Diagnosis not present

## 2021-04-01 NOTE — Progress Notes (Signed)
  HPI with pertinent ROS:   CC: Skin lesion removal, RLS follow up  HPI: Pleasant 70 year old female presenting today for removal of the skin lesion previously noted to her right upper chest. Reports the lesion has been there for about 10 years but has recently had some size changes. Denies itching or pain.   Has not started Mirapex as previously prescribed. Reports her daughter wanted to look up the side effects before she took it but she hasn't done it yet. Still having difficulty with restless legs that interferes with her ability to rest at night.   I reviewed the past medical history, family history, social history, surgical history, and allergies today and no changes were needed.  Please see the problem list section below in epic for further details.   Physical exam:   General: Well Developed, well nourished, and in no acute distress.  Neuro: Alert and oriented x3.  HEENT: Normocephalic, atraumatic.  Skin: Warm and dry. 1cm x 0.6cm x 47mm raised brown lesion to the right upper chest just below the medial border of the clavicle. Cardiac: Regular rate and rhythm.  Respiratory: Not using accessory muscles, speaking in full sentences.  Impression and Recommendations:    1. Skin lesion After informed consent was obtained, using Betadine for cleansing and 1% Lidocaine without epinephrine for anesthetic, with sterile technique, shave excision and electrocautery was performed. Antibiotic dressing is applied, and wound care instructions provided.  Be alert for any signs of cutaneous infection. The procedure was well tolerated without complications. Follow up: the specimen is labeled and sent to pathology for evaluation, the patient may return prn. - Dermatology pathology  2. Restless leg Recommend starting Mirapex as previously prescribed for management of RLS since other medications have been unhelpful.   Return if symptoms worsen or fail to  improve. ___________________________________________ Thayer Ohm, DNP, APRN, FNP-BC Primary Care and Sports Medicine [facility]

## 2021-04-08 ENCOUNTER — Encounter: Payer: Self-pay | Admitting: Medical-Surgical

## 2021-04-08 LAB — HM DIABETES EYE EXAM

## 2021-05-10 ENCOUNTER — Encounter: Payer: Self-pay | Admitting: Medical-Surgical

## 2021-05-10 DIAGNOSIS — H43393 Other vitreous opacities, bilateral: Secondary | ICD-10-CM | POA: Insufficient documentation

## 2021-05-10 DIAGNOSIS — H35423 Microcystoid degeneration of retina, bilateral: Secondary | ICD-10-CM | POA: Insufficient documentation

## 2021-05-10 DIAGNOSIS — H353231 Exudative age-related macular degeneration, bilateral, with active choroidal neovascularization: Secondary | ICD-10-CM | POA: Insufficient documentation

## 2021-05-10 DIAGNOSIS — H43813 Vitreous degeneration, bilateral: Secondary | ICD-10-CM | POA: Insufficient documentation

## 2021-05-16 ENCOUNTER — Telehealth: Payer: Self-pay

## 2021-05-16 DIAGNOSIS — G2581 Restless legs syndrome: Secondary | ICD-10-CM

## 2021-05-16 NOTE — Telephone Encounter (Signed)
Pt's daughter called stating that the Mirapex is not helping with her mother's RLS. She states that her mother is not able to get any sleep at all. She knows they have tried several meds in the past but would like to know what can be done at this time.

## 2021-05-16 NOTE — Telephone Encounter (Signed)
Spoke with Vickie who states pt is agreeable for the referral to Neuro.

## 2021-05-16 NOTE — Telephone Encounter (Signed)
Referral entered  

## 2021-05-16 NOTE — Telephone Encounter (Signed)
LVM for pt's daughter Burna Cash (1st attempt)    Per verbal instructions from Joy, pt has tried and failed several meds, even at the highest doses. The next step is a referral to Neuro. Does pt want Joy to put orders in for the referral?

## 2021-05-23 ENCOUNTER — Other Ambulatory Visit: Payer: Self-pay

## 2021-05-23 DIAGNOSIS — G2581 Restless legs syndrome: Secondary | ICD-10-CM

## 2021-05-26 ENCOUNTER — Telehealth: Payer: Self-pay | Admitting: Medical-Surgical

## 2021-05-26 NOTE — Telephone Encounter (Signed)
I called pts daughter and let her know the referral has been sent to Neurology in [place]

## 2021-05-26 NOTE — Telephone Encounter (Signed)
Pts daughter states she has been calling for the past 3 days and leaving a voicemail on her assistants phone line and has not received a phone call back.

## 2021-07-13 ENCOUNTER — Other Ambulatory Visit: Payer: Self-pay | Admitting: Medical-Surgical

## 2021-07-15 ENCOUNTER — Ambulatory Visit (INDEPENDENT_AMBULATORY_CARE_PROVIDER_SITE_OTHER): Payer: Medicare HMO | Admitting: Sports Medicine

## 2021-07-15 ENCOUNTER — Other Ambulatory Visit: Payer: Self-pay

## 2021-07-15 DIAGNOSIS — N3001 Acute cystitis with hematuria: Secondary | ICD-10-CM | POA: Diagnosis not present

## 2021-07-15 DIAGNOSIS — N3000 Acute cystitis without hematuria: Secondary | ICD-10-CM | POA: Insufficient documentation

## 2021-07-15 LAB — POCT URINALYSIS DIP (CLINITEK)
Bilirubin, UA: NEGATIVE
Glucose, UA: NEGATIVE mg/dL
Ketones, POC UA: NEGATIVE mg/dL
Nitrite, UA: POSITIVE — AB
POC PROTEIN,UA: NEGATIVE
Spec Grav, UA: 1.02 (ref 1.010–1.025)
Urobilinogen, UA: 1 E.U./dL
pH, UA: 6 (ref 5.0–8.0)

## 2021-07-15 MED ORDER — LEVOFLOXACIN 750 MG PO TABS: 750.0000 mg | ORAL_TABLET | Freq: Every day | ORAL | 0 refills | Status: DC

## 2021-07-15 NOTE — Assessment & Plan Note (Signed)
Stefanie Wilson has urinary urgency, frequency, burning, urinalysis is positive for blood, leukocytes, nitrates, suspect E. coli. ?We will culture the urine, adding Levaquin, return to see [place] as needed, hydrate aggressively ?

## 2021-07-15 NOTE — Progress Notes (Signed)
? ? ?  Procedures performed today:   ? ?None. ? ?Independent interpretation of notes and tests performed by another provider:  ? ?None. ? ?Brief History, Exam, Impression, and Recommendations:   ? ?Acute cystitis ?Tenille has urinary urgency, frequency, burning, urinalysis is positive for blood, leukocytes, nitrates, suspect E. coli. ?We will culture the urine, adding Levaquin, return to see [place] as needed, hydrate aggressively ? ? ? ?___________________________________________ ?Ihor Austin. Benjamin Stain, M.D., ABFM., CAQSM. ?Primary Care and Sports Medicine ?[name] ? ?Adjunct Instructor of Family Medicine  ?[employer] ?

## 2021-07-17 LAB — URINE CULTURE
MICRO NUMBER:: 13180351
SPECIMEN QUALITY:: ADEQUATE

## 2021-07-23 ENCOUNTER — Encounter: Payer: Self-pay | Admitting: Sports Medicine

## 2021-07-25 NOTE — Progress Notes (Signed)
?  HPI with pertinent ROS:  ? ?CC: 6 month follow up ? ?HPI: ?Pleasant 71 year old female presenting today for: ? ?DM- no currently taking medications and does not check her sugars at home. No regular exercise.  ? ?Smoker- smoking approximately 1/2 pack of cigarettes per day. Has been smoking since she was about 71 years old. No current interest in smoking cessation. Has not tried smoking cessation aids before.  ? ?HLD- taking Lipitor 40mg  daily, tolerating well without side effects.  ? ?Due for the PNA and Shingrix vaccines. Patient reports no interest in either of these.  ? ?I reviewed the past medical history, family history, social history, surgical history, and allergies today and no changes were needed.  Please see the problem list section below in epic for further details. ? ? ?Physical exam:  ? ?General: Well Developed, well nourished, and in no acute distress.  ?Neuro: Alert and oriented x3.  ?HEENT: Normocephalic, atraumatic.  ?Skin: Warm and dry. ?Cardiac: Regular rate and rhythm, no murmurs rubs or gallops, no lower extremity edema.  ?Respiratory: Clear to auscultation bilaterally. Not using accessory muscles, speaking in full sentences. ? ?Impression and Recommendations:   ? ?1. Type 2 diabetes mellitus without complication, with long-term current use of insulin (HCC) ?POCT hgbA1c 6.5% today. Diet controlled. No need for medication at this time. Advised to continue a diabetic diet. Checking CMP. POCT microalbumin negative.  ? ?2. Cigarette smoker ?Discussed the risks of smoking especially long term. Patient aware of the risks of continuing to smoke and verbalized understanding of the benefits. She is not at a point of wanting to quit. Declined cessation interventions.  ? ?3. Mixed hyperlipidemia ?Checking lipids. Continue Atorvastatin 40mg  daily.  ? ?4. Erythrocytosis ?Checking CBC.  ? ?5. Need for pneumococcal vaccination ?6. Need for shingles vaccine ?Discussed recommendations for vaccination and  provided education on each vaccine, the expected side effects, and the benefits of protection. Patient verbalized understanding but not interested. Declined both vaccines today.  ? ?Return in about 6 months (around 01/25/2022) for chronic disease follow up. ?___________________________________________ ? , DNP, APRN, FNP-BC ?Primary Care and Sports Medicine ?[facility] 03/27/2022 ?

## 2021-07-26 ENCOUNTER — Ambulatory Visit (INDEPENDENT_AMBULATORY_CARE_PROVIDER_SITE_OTHER): Payer: Medicare HMO | Admitting: Medical-Surgical

## 2021-07-26 ENCOUNTER — Encounter: Payer: Self-pay | Admitting: Medical-Surgical

## 2021-07-26 VITALS — BP 112/77 | HR 80 | Resp 20 | Ht 62.0 in | Wt 167.1 lb

## 2021-07-26 DIAGNOSIS — F17210 Nicotine dependence, cigarettes, uncomplicated: Secondary | ICD-10-CM | POA: Diagnosis not present

## 2021-07-26 DIAGNOSIS — D751 Secondary polycythemia: Secondary | ICD-10-CM

## 2021-07-26 DIAGNOSIS — E782 Mixed hyperlipidemia: Secondary | ICD-10-CM

## 2021-07-26 DIAGNOSIS — Z794 Long term (current) use of insulin: Secondary | ICD-10-CM | POA: Diagnosis not present

## 2021-07-26 DIAGNOSIS — E119 Type 2 diabetes mellitus without complications: Secondary | ICD-10-CM

## 2021-07-26 DIAGNOSIS — Z23 Encounter for immunization: Secondary | ICD-10-CM

## 2021-07-26 LAB — POCT GLYCOSYLATED HEMOGLOBIN (HGB A1C): HbA1c, POC (prediabetic range): 6.5 % — AB (ref 5.7–6.4)

## 2021-07-26 LAB — POCT UA - MICROALBUMIN
Albumin/Creatinine Ratio, Urine, POC: <30
Creatinine, POC: 100 mg/dL
Microalbumin Ur, POC: 10 mg/L

## 2021-07-27 LAB — CBC
HCT: 52 % — ABNORMAL HIGH (ref 35.0–45.0)
Hemoglobin: 16.9 g/dL — ABNORMAL HIGH (ref 11.7–15.5)
MCH: 29.8 pg (ref 27.0–33.0)
MCHC: 32.5 g/dL (ref 32.0–36.0)
MCV: 91.7 fL (ref 80.0–100.0)
MPV: 9.7 fL (ref 7.5–12.5)
Platelets: 301 10*3/uL (ref 140–400)
RBC: 5.67 10*6/uL — ABNORMAL HIGH (ref 3.80–5.10)
RDW: 13 % (ref 11.0–15.0)
WBC: 9.1 10*3/uL (ref 3.8–10.8)

## 2021-07-27 LAB — COMPLETE METABOLIC PANEL WITH GFR
AG Ratio: 1.4 (calc) (ref 1.0–2.5)
ALT: 24 U/L (ref 6–29)
AST: 23 U/L (ref 10–35)
Albumin: 4 g/dL (ref 3.6–5.1)
Alkaline phosphatase (APISO): 76 U/L (ref 37–153)
BUN: 8 mg/dL (ref 7–25)
CO2: 26 mmol/L (ref 20–32)
Calcium: 9.1 mg/dL (ref 8.6–10.4)
Chloride: 108 mmol/L (ref 98–110)
Creat: 0.87 mg/dL (ref 0.60–1.00)
Globulin: 2.9 g/dL (calc) (ref 1.9–3.7)
Glucose, Bld: 104 mg/dL — ABNORMAL HIGH (ref 65–99)
Potassium: 4.4 mmol/L (ref 3.5–5.3)
Sodium: 141 mmol/L (ref 135–146)
Total Bilirubin: 0.4 mg/dL (ref 0.2–1.2)
Total Protein: 6.9 g/dL (ref 6.1–8.1)
eGFR: 72 mL/min/{1.73_m2} (ref >=60)

## 2021-07-27 LAB — LIPID PANEL
Cholesterol: 110 mg/dL (ref <200)
HDL: 41 mg/dL — ABNORMAL LOW (ref >=50)
LDL Cholesterol (Calc): 50 mg/dL (calc)
Non-HDL Cholesterol (Calc): 69 mg/dL (calc) (ref <130)
Total CHOL/HDL Ratio: 2.7 (calc) (ref <5.0)
Triglycerides: 105 mg/dL (ref <150)

## 2021-08-23 DIAGNOSIS — H43393 Other vitreous opacities, bilateral: Secondary | ICD-10-CM | POA: Diagnosis not present

## 2021-08-23 DIAGNOSIS — H353231 Exudative age-related macular degeneration, bilateral, with active choroidal neovascularization: Secondary | ICD-10-CM | POA: Diagnosis not present

## 2021-08-23 DIAGNOSIS — H35423 Microcystoid degeneration of retina, bilateral: Secondary | ICD-10-CM | POA: Diagnosis not present

## 2021-08-23 DIAGNOSIS — H43813 Vitreous degeneration, bilateral: Secondary | ICD-10-CM | POA: Diagnosis not present

## 2021-09-23 DIAGNOSIS — H353231 Exudative age-related macular degeneration, bilateral, with active choroidal neovascularization: Secondary | ICD-10-CM | POA: Diagnosis not present

## 2021-10-04 DIAGNOSIS — G2581 Restless legs syndrome: Secondary | ICD-10-CM | POA: Diagnosis not present

## 2021-10-18 DIAGNOSIS — H903 Sensorineural hearing loss, bilateral: Secondary | ICD-10-CM | POA: Diagnosis not present

## 2021-11-16 DIAGNOSIS — H35423 Microcystoid degeneration of retina, bilateral: Secondary | ICD-10-CM | POA: Diagnosis not present

## 2021-11-16 DIAGNOSIS — H353231 Exudative age-related macular degeneration, bilateral, with active choroidal neovascularization: Secondary | ICD-10-CM | POA: Diagnosis not present

## 2021-11-16 DIAGNOSIS — H43813 Vitreous degeneration, bilateral: Secondary | ICD-10-CM | POA: Diagnosis not present

## 2021-11-16 DIAGNOSIS — H43393 Other vitreous opacities, bilateral: Secondary | ICD-10-CM | POA: Diagnosis not present

## 2021-12-28 ENCOUNTER — Other Ambulatory Visit: Payer: Self-pay | Admitting: Medical-Surgical

## 2022-01-10 DIAGNOSIS — H353231 Exudative age-related macular degeneration, bilateral, with active choroidal neovascularization: Secondary | ICD-10-CM | POA: Diagnosis not present

## 2022-01-19 DIAGNOSIS — I2510 Atherosclerotic heart disease of native coronary artery without angina pectoris: Secondary | ICD-10-CM | POA: Diagnosis not present

## 2022-01-19 DIAGNOSIS — J439 Emphysema, unspecified: Secondary | ICD-10-CM | POA: Diagnosis not present

## 2022-01-19 DIAGNOSIS — R918 Other nonspecific abnormal finding of lung field: Secondary | ICD-10-CM | POA: Diagnosis not present

## 2022-01-25 DIAGNOSIS — R058 Other specified cough: Secondary | ICD-10-CM | POA: Diagnosis not present

## 2022-01-25 DIAGNOSIS — F17210 Nicotine dependence, cigarettes, uncomplicated: Secondary | ICD-10-CM | POA: Diagnosis not present

## 2022-01-25 DIAGNOSIS — R911 Solitary pulmonary nodule: Secondary | ICD-10-CM | POA: Diagnosis not present

## 2022-02-06 NOTE — Progress Notes (Unsigned)
   Established Patient Office Visit  Subjective   Patient ID: Stefanie Wilson, female   DOB: [birth year] Age: 71 y.o. MRN: [id number]   No chief complaint on file.  HPI Pleasant 71 year old female presenting today for the following:  Diabetes:  HLD:  Tobacco use:   Objective:    There were no vitals filed for this visit.  Physical Exam   No results found for this or any previous visit (from the past 24 hour(s)).   {Labs (Optional):23779}  The ASCVD Risk score (Arnett DK, et al., 2019) failed to calculate for the following reasons:   The valid total cholesterol range is 130 to 320 mg/dL   Assessment & Plan:   No problem-specific Assessment & Plan notes found for this encounter.   No follow-ups on file.  ___________________________________________ Clearnce Sorrel, DNP, APRN, FNP-BC Primary Care and [place]

## 2022-02-07 ENCOUNTER — Ambulatory Visit (INDEPENDENT_AMBULATORY_CARE_PROVIDER_SITE_OTHER): Payer: Medicare HMO | Admitting: Medical-Surgical

## 2022-02-07 ENCOUNTER — Encounter: Payer: Self-pay | Admitting: Medical-Surgical

## 2022-02-07 VITALS — BP 143/84 | HR 79 | Resp 20 | Ht 62.0 in | Wt 173.0 lb

## 2022-02-07 DIAGNOSIS — Z794 Long term (current) use of insulin: Secondary | ICD-10-CM | POA: Diagnosis not present

## 2022-02-07 DIAGNOSIS — Z2821 Immunization not carried out because of patient refusal: Secondary | ICD-10-CM | POA: Diagnosis not present

## 2022-02-07 DIAGNOSIS — Z23 Encounter for immunization: Secondary | ICD-10-CM

## 2022-02-07 DIAGNOSIS — E119 Type 2 diabetes mellitus without complications: Secondary | ICD-10-CM

## 2022-02-07 DIAGNOSIS — R030 Elevated blood-pressure reading, without diagnosis of hypertension: Secondary | ICD-10-CM

## 2022-02-07 DIAGNOSIS — D751 Secondary polycythemia: Secondary | ICD-10-CM

## 2022-02-07 DIAGNOSIS — F17210 Nicotine dependence, cigarettes, uncomplicated: Secondary | ICD-10-CM | POA: Diagnosis not present

## 2022-02-07 DIAGNOSIS — E782 Mixed hyperlipidemia: Secondary | ICD-10-CM

## 2022-02-07 DIAGNOSIS — G2581 Restless legs syndrome: Secondary | ICD-10-CM

## 2022-02-07 LAB — POCT GLYCOSYLATED HEMOGLOBIN (HGB A1C): HbA1c, POC (controlled diabetic range): 6.3 % (ref 0.0–7.0)

## 2022-02-07 MED ORDER — ATORVASTATIN CALCIUM 40 MG PO TABS: 40.0000 mg | ORAL_TABLET | Freq: Every day | ORAL | 0 refills | Status: DC

## 2022-02-07 MED ORDER — ROPINIROLE HCL 0.5 MG PO TABS: 0.5000 mg | ORAL_TABLET | Freq: Every evening | ORAL | 1 refills | Status: DC

## 2022-03-14 DIAGNOSIS — H35423 Microcystoid degeneration of retina, bilateral: Secondary | ICD-10-CM | POA: Diagnosis not present

## 2022-03-14 DIAGNOSIS — H353231 Exudative age-related macular degeneration, bilateral, with active choroidal neovascularization: Secondary | ICD-10-CM | POA: Diagnosis not present

## 2022-03-14 DIAGNOSIS — H43813 Vitreous degeneration, bilateral: Secondary | ICD-10-CM | POA: Diagnosis not present

## 2022-03-14 DIAGNOSIS — H43393 Other vitreous opacities, bilateral: Secondary | ICD-10-CM | POA: Diagnosis not present

## 2022-05-09 DIAGNOSIS — H43393 Other vitreous opacities, bilateral: Secondary | ICD-10-CM | POA: Diagnosis not present

## 2022-05-09 DIAGNOSIS — H35423 Microcystoid degeneration of retina, bilateral: Secondary | ICD-10-CM | POA: Diagnosis not present

## 2022-05-09 DIAGNOSIS — H43813 Vitreous degeneration, bilateral: Secondary | ICD-10-CM | POA: Diagnosis not present

## 2022-05-09 DIAGNOSIS — H353211 Exudative age-related macular degeneration, right eye, with active choroidal neovascularization: Secondary | ICD-10-CM | POA: Diagnosis not present

## 2022-05-09 DIAGNOSIS — H353221 Exudative age-related macular degeneration, left eye, with active choroidal neovascularization: Secondary | ICD-10-CM | POA: Diagnosis not present

## 2022-06-27 ENCOUNTER — Other Ambulatory Visit: Payer: Self-pay | Admitting: Medical-Surgical

## 2022-06-27 DIAGNOSIS — Z1231 Encounter for screening mammogram for malignant neoplasm of breast: Secondary | ICD-10-CM

## 2022-07-14 DIAGNOSIS — H43813 Vitreous degeneration, bilateral: Secondary | ICD-10-CM | POA: Diagnosis not present

## 2022-07-14 DIAGNOSIS — H353221 Exudative age-related macular degeneration, left eye, with active choroidal neovascularization: Secondary | ICD-10-CM | POA: Diagnosis not present

## 2022-07-14 DIAGNOSIS — H353211 Exudative age-related macular degeneration, right eye, with active choroidal neovascularization: Secondary | ICD-10-CM | POA: Diagnosis not present

## 2022-07-14 DIAGNOSIS — H35423 Microcystoid degeneration of retina, bilateral: Secondary | ICD-10-CM | POA: Diagnosis not present

## 2022-07-14 DIAGNOSIS — H43393 Other vitreous opacities, bilateral: Secondary | ICD-10-CM | POA: Diagnosis not present

## 2022-07-31 DIAGNOSIS — Z1231 Encounter for screening mammogram for malignant neoplasm of breast: Secondary | ICD-10-CM | POA: Diagnosis not present

## 2022-07-31 DIAGNOSIS — F17210 Nicotine dependence, cigarettes, uncomplicated: Secondary | ICD-10-CM | POA: Diagnosis not present

## 2022-07-31 DIAGNOSIS — R918 Other nonspecific abnormal finding of lung field: Secondary | ICD-10-CM | POA: Diagnosis not present

## 2022-08-02 ENCOUNTER — Ambulatory Visit: Payer: Medicare HMO

## 2022-08-03 ENCOUNTER — Other Ambulatory Visit: Payer: Self-pay | Admitting: Medical-Surgical

## 2022-08-10 ENCOUNTER — Ambulatory Visit (INDEPENDENT_AMBULATORY_CARE_PROVIDER_SITE_OTHER): Payer: Medicare HMO | Admitting: Medical-Surgical

## 2022-08-10 ENCOUNTER — Encounter: Payer: Self-pay | Admitting: Medical-Surgical

## 2022-08-10 VITALS — BP 152/86 | HR 72 | Resp 20 | Ht 62.0 in | Wt 165.2 lb

## 2022-08-10 DIAGNOSIS — R030 Elevated blood-pressure reading, without diagnosis of hypertension: Secondary | ICD-10-CM

## 2022-08-10 DIAGNOSIS — E782 Mixed hyperlipidemia: Secondary | ICD-10-CM | POA: Diagnosis not present

## 2022-08-10 DIAGNOSIS — R809 Proteinuria, unspecified: Secondary | ICD-10-CM

## 2022-08-10 DIAGNOSIS — F17210 Nicotine dependence, cigarettes, uncomplicated: Secondary | ICD-10-CM | POA: Diagnosis not present

## 2022-08-10 DIAGNOSIS — G2581 Restless legs syndrome: Secondary | ICD-10-CM

## 2022-08-10 DIAGNOSIS — E1129 Type 2 diabetes mellitus with other diabetic kidney complication: Secondary | ICD-10-CM

## 2022-08-10 DIAGNOSIS — Z1211 Encounter for screening for malignant neoplasm of colon: Secondary | ICD-10-CM | POA: Diagnosis not present

## 2022-08-10 DIAGNOSIS — E119 Type 2 diabetes mellitus without complications: Secondary | ICD-10-CM

## 2022-08-10 LAB — POCT UA - MICROALBUMIN
Creatinine, POC: 50 mg/dL
Microalbumin Ur, POC: 30 mg/L

## 2022-08-10 LAB — POCT GLYCOSYLATED HEMOGLOBIN (HGB A1C)
HbA1c, POC (controlled diabetic range): 6.3 % (ref 0.0–7.0)
Hemoglobin A1C: 6.3 % — AB (ref 4.0–5.6)

## 2022-08-10 LAB — CBC WITH DIFFERENTIAL/PLATELET
MCH: 30.3 pg (ref 27.0–33.0)
Monocytes Relative: 6.4 %
Neutro Abs: 4299 cells/uL (ref 1500–7800)
Platelets: 280 10*3/uL (ref 140–400)

## 2022-08-10 MED ORDER — LISINOPRIL 5 MG PO TABS
5.0000 mg | ORAL_TABLET | Freq: Every day | ORAL | 3 refills | Status: DC
Start: 2022-08-10 — End: 2023-02-09

## 2022-08-10 NOTE — Progress Notes (Signed)
        Established patient visit  History, exam, impression, and plan:  1. Type 2 diabetes mellitus with diabetic microalbuminuria, without long-term current use of insulin Pleasant 72 year old female presenting today with a history of type 2 diabetes.  Previous hemoglobin A1c 6 months ago was 6.3% indicating excellent control without medications.  Does not check her sugar at home and follows a general diet.  POCT hemoglobin A1c recheck today and was again 6.3%.  POCT microalbumin was normal last year however recheck today shows abnormal.  Reviewed recommendations for kidney protection in the setting of diabetes.  Starting lisinopril 5 mg daily for microalbuminuria. - POCT HgB A1C - POCT UA - Microalbumin  2. Mixed hyperlipidemia Checking labs as below.  Has been taking atorvastatin 40 mg daily, tolerating well without side effects.  No regular intentional exercise but is trying to follow a low-fat heart healthy diet.  Rechecking lipids.  Continue atorvastatin. - COMPLETE METABOLIC PANEL WITH GFR - Lipid panel  3. Elevated blood pressure reading Has had several readings with elevated blood pressures although they do seem to be quite variable.  Home monitoring showed labile blood pressures when last checked.  Checking labs today.  In the setting of microalbuminuria, diabetes, and continued elevated readings, starting lisinopril 5 mg daily.  Recommend low-sodium diet, regular intentional exercise, and weight management.  Monitor blood pressure at home with a goal of 130/80 or less.  Since she has difficulty getting to the doctor's office due to transportation limitations, I will reach out to her via phone after 2 weeks on the medication to see how her blood pressures are running.  Patient verbalized understanding and is agreeable to the plan. - CBC with Differential/Platelet - COMPLETE METABOLIC PANEL WITH GFR - Lipid panel - lisinopril (ZESTRIL) 5 MG tablet; Take 1 tablet (5 mg total) by mouth  daily.  Dispense: 90 tablet; Refill: 3  4. Colon cancer screening Discussed recommendations for colon cancer screening.  She has never had a colonoscopy and is hesitant to proceed.  She is fairly low risk for colon cancer.  Plan for Cologuard today.  Reviewed indications should it come back positive and the follow-up recommendation for colonoscopy at that point. - Cologuard  5.  Tobacco abuse counseling Started smoking at the age of 36.  Reports that she is at approximately 1/2 pack/cigarettes/day at this point.  She has seen a lung doctor due to a lung nodule that was identified.  Discussed the recommendations for lung cancer screening with low-dose chest CT.  Declined imaging today.  6.  Restless leg syndrome History of RLS previously difficult to control with medications.  We did try several options and dose changes however she has been doing fairly well on Requip 0.5 mg nightly.  Feels the medication is still working well for her and would like to have a refill today.  Procedures performed this visit: None.  Return in about 6 months (around 02/09/2023) for DM/HTN/HLD follow up.  __________________________________ Thayer Ohm, DNP, APRN, FNP-BC Primary Care and Sports Medicine [facility] [place]

## 2022-08-11 LAB — CBC WITH DIFFERENTIAL/PLATELET
Absolute Monocytes: 531 cells/uL (ref 200–950)
Basophils Absolute: 100 cells/uL (ref 0–200)
Basophils Relative: 1.2 %
Eosinophils Absolute: 141 cells/uL (ref 15–500)
Eosinophils Relative: 1.7 %
HCT: 51.1 % — ABNORMAL HIGH (ref 35.0–45.0)
Hemoglobin: 16.7 g/dL — ABNORMAL HIGH (ref 11.7–15.5)
Lymphs Abs: 3229 cells/uL (ref 850–3900)
MCHC: 32.7 g/dL (ref 32.0–36.0)
MCV: 92.6 fL (ref 80.0–100.0)
MPV: 9.3 fL (ref 7.5–12.5)
Neutrophils Relative %: 51.8 %
RBC: 5.52 10*6/uL — ABNORMAL HIGH (ref 3.80–5.10)
RDW: 13 % (ref 11.0–15.0)
Total Lymphocyte: 38.9 %
WBC: 8.3 10*3/uL (ref 3.8–10.8)

## 2022-08-11 LAB — COMPLETE METABOLIC PANEL WITH GFR
AG Ratio: 1.3 (calc) (ref 1.0–2.5)
ALT: 17 U/L (ref 6–29)
AST: 20 U/L (ref 10–35)
Albumin: 4 g/dL (ref 3.6–5.1)
Alkaline phosphatase (APISO): 78 U/L (ref 37–153)
BUN: 9 mg/dL (ref 7–25)
CO2: 26 mmol/L (ref 20–32)
Calcium: 9.1 mg/dL (ref 8.6–10.4)
Chloride: 107 mmol/L (ref 98–110)
Creat: 0.79 mg/dL (ref 0.60–1.00)
Globulin: 3.1 g/dL (calc) (ref 1.9–3.7)
Glucose, Bld: 107 mg/dL — ABNORMAL HIGH (ref 65–99)
Potassium: 4.7 mmol/L (ref 3.5–5.3)
Sodium: 141 mmol/L (ref 135–146)
Total Bilirubin: 0.4 mg/dL (ref 0.2–1.2)
Total Protein: 7.1 g/dL (ref 6.1–8.1)
eGFR: 80 mL/min/{1.73_m2} (ref >=60)

## 2022-08-11 LAB — LIPID PANEL
Cholesterol: 113 mg/dL (ref <200)
HDL: 38 mg/dL — ABNORMAL LOW (ref >=50)
LDL Cholesterol (Calc): 53 mg/dL (calc)
Non-HDL Cholesterol (Calc): 75 mg/dL (calc) (ref <130)
Total CHOL/HDL Ratio: 3 (calc) (ref <5.0)
Triglycerides: 141 mg/dL (ref <150)

## 2022-08-29 ENCOUNTER — Telehealth: Payer: Self-pay | Admitting: General Practice

## 2022-08-29 NOTE — Telephone Encounter (Signed)
Contacted Stefanie Wilson to schedule their annual wellness visit. Appointment made for 10/03/22.  [place] Charon, RN BSN

## 2022-08-31 DIAGNOSIS — Z1211 Encounter for screening for malignant neoplasm of colon: Secondary | ICD-10-CM | POA: Diagnosis not present

## 2022-09-14 LAB — COLOGUARD: COLOGUARD: NEGATIVE

## 2022-09-19 DIAGNOSIS — H43393 Other vitreous opacities, bilateral: Secondary | ICD-10-CM | POA: Diagnosis not present

## 2022-09-19 DIAGNOSIS — H35423 Microcystoid degeneration of retina, bilateral: Secondary | ICD-10-CM | POA: Diagnosis not present

## 2022-09-19 DIAGNOSIS — H43813 Vitreous degeneration, bilateral: Secondary | ICD-10-CM | POA: Diagnosis not present

## 2022-09-19 DIAGNOSIS — H353231 Exudative age-related macular degeneration, bilateral, with active choroidal neovascularization: Secondary | ICD-10-CM | POA: Diagnosis not present

## 2022-10-03 ENCOUNTER — Ambulatory Visit (INDEPENDENT_AMBULATORY_CARE_PROVIDER_SITE_OTHER): Payer: Medicare HMO | Admitting: Medical-Surgical

## 2022-10-03 DIAGNOSIS — Z0000 Encounter for general adult medical examination without abnormal findings: Secondary | ICD-10-CM

## 2022-10-03 DIAGNOSIS — Z1231 Encounter for screening mammogram for malignant neoplasm of breast: Secondary | ICD-10-CM

## 2022-10-03 NOTE — Patient Instructions (Addendum)
MEDICARE ANNUAL WELLNESS VISIT Health Maintenance Summary and Written Plan of Care  Stefanie Wilson ,  Thank you for allowing me to perform your Medicare Annual Wellness Visit and for your ongoing commitment to your health.   Health Maintenance & Immunization History Health Maintenance  Topic Date Due   DTaP/Tdap/Td (1 - Tdap) Never done   OPHTHALMOLOGY EXAM  10/03/2022 (Originally 04/08/2022)   Zoster Vaccines- Shingrix (1 of 2) 01/03/2023 (Originally 10/09/2000)   Pneumonia Vaccine 63+ Years old (1 of 2 - PCV) 02/08/2023 (Originally 10/09/1956)   Lung Cancer Screening  08/10/2023 (Originally 10/09/2000)   INFLUENZA VACCINE  11/23/2022   FOOT EXAM  02/08/2023   HEMOGLOBIN A1C  02/09/2023   MAMMOGRAM  03/17/2023   Diabetic kidney evaluation - eGFR measurement  08/10/2023   Diabetic kidney evaluation - Urine ACR  08/10/2023   Medicare Annual Wellness (AWV)  10/03/2023   DEXA SCAN  03/16/2024   Fecal DNA (Cologuard)  08/30/2025   HPV VACCINES  Aged Out   COVID-19 Vaccine  Discontinued   Hepatitis C Screening  Discontinued    There is no immunization history on file for this patient.  These are the patient goals that we discussed:  Goals Addressed               This Visit's Progress     Patient Stated (pt-stated)        Patient stated that she would like to continue to maintain her current lifestyle.         This is a list of Health Maintenance Items that are overdue or due now: Health Maintenance Due  Topic Date Due   DTaP/Tdap/Td (1 - Tdap) Never done   Pneumococcal vaccine  Td vaccine Eye exam - please have those records faxed.  Shingles vaccine Mammogram-referral sent.  Patient declined the vaccines at this time.  Orders/Referrals Placed Today: Orders Placed This Encounter  Procedures   Mammogram 3D SCREEN BREAST BILATERAL    Standing Status:   Future    Standing Expiration Date:   10/03/2023    Scheduling Instructions:     Please call cell number  (patient's daughter) to schedule.    Order Specific Question:   Reason for Exam (SYMPTOM  OR DIAGNOSIS REQUIRED)    Answer:   breast cancer screening    Order Specific Question:   Preferred imaging location?    Answer:   [name]    (Contact our referral department at [phone] if you have not spoken with someone about your referral appointment within the next 5 days)    Follow-up Plan Follow-up with Christen Butter, NP as planned Eye exam - please have those records faxed.  Medicare wellness visit in one year.  AVS printed and mailed to the patient.      Health Maintenance, Female Adopting a healthy lifestyle and getting preventive care are important in promoting health and wellness. Ask your health care provider about: The right schedule for you to have regular tests and exams. Things you can do on your own to prevent diseases and keep yourself healthy. What should I know about diet, weight, and exercise? Eat a healthy diet  Eat a diet that includes plenty of vegetables, fruits, low-fat dairy products, and lean protein. Do not eat a lot of foods that are high in solid fats, added sugars, or sodium. Maintain a healthy weight Body mass index (BMI) is used to identify weight problems. It estimates body fat based on height and weight. Your health  care provider can help determine your BMI and help you achieve or maintain a healthy weight. Get regular exercise Get regular exercise. This is one of the most important things you can do for your health. Most adults should: Exercise for at least 150 minutes each week. The exercise should increase your heart rate and make you sweat (moderate-intensity exercise). Do strengthening exercises at least twice a week. This is in addition to the moderate-intensity exercise. Spend less time sitting. Even light physical activity can be beneficial. Watch cholesterol and blood lipids Have your blood tested for lipids and cholesterol at 72  years of age, then have this test every 5 years. Have your cholesterol levels checked more often if: Your lipid or cholesterol levels are high. You are older than 72 years of age. You are at high risk for heart disease. What should I know about cancer screening? Depending on your health history and family history, you may need to have cancer screening at various ages. This may include screening for: Breast cancer. Cervical cancer. Colorectal cancer. Skin cancer. Lung cancer. What should I know about heart disease, diabetes, and high blood pressure? Blood pressure and heart disease High blood pressure causes heart disease and increases the risk of stroke. This is more likely to develop in people who have high blood pressure readings or are overweight. Have your blood pressure checked: Every 3-5 years if you are 32-32 years of age. Every year if you are 8 years old or older. Diabetes Have regular diabetes screenings. This checks your fasting blood sugar level. Have the screening done: Once every three years after age 61 if you are at a normal weight and have a low risk for diabetes. More often and at a younger age if you are overweight or have a high risk for diabetes. What should I know about preventing infection? Hepatitis B If you have a higher risk for hepatitis B, you should be screened for this virus. Talk with your health care provider to find out if you are at risk for hepatitis B infection. Hepatitis C Testing is recommended for: Everyone born from [birth year] through 1965. Anyone with known risk factors for hepatitis C. Sexually transmitted infections (STIs) Get screened for STIs, including gonorrhea and chlamydia, if: You are sexually active and are younger than 72 years of age. You are older than 72 years of age and your health care provider tells you that you are at risk for this type of infection. Your sexual activity has changed since you were last screened, and you are at  increased risk for chlamydia or gonorrhea. Ask your health care provider if you are at risk. Ask your health care provider about whether you are at high risk for HIV. Your health care provider may recommend a prescription medicine to help prevent HIV infection. If you choose to take medicine to prevent HIV, you should first get tested for HIV. You should then be tested every 3 months for as long as you are taking the medicine. Pregnancy If you are about to stop having your period (premenopausal) and you may become pregnant, seek counseling before you get pregnant. Take 400 to 800 micrograms (mcg) of folic acid every day if you become pregnant. Ask for birth control (contraception) if you want to prevent pregnancy. Osteoporosis and menopause Osteoporosis is a disease in which the bones lose minerals and strength with aging. This can result in bone fractures. If you are 47 years old or older, or if you are at risk  for osteoporosis and fractures, ask your health care provider if you should: Be screened for bone loss. Take a calcium or vitamin D supplement to lower your risk of fractures. Be given hormone replacement therapy (HRT) to treat symptoms of menopause. Follow these instructions at home: Alcohol use Do not drink alcohol if: Your health care provider tells you not to drink. You are pregnant, may be pregnant, or are planning to become pregnant. If you drink alcohol: Limit how much you have to: 0-1 drink a day. Know how much alcohol is in your drink. In the [place] one drink equals one 12 oz bottle of beer (355 mL), one 5 oz glass of wine (148 mL), or one 1 oz glass of hard liquor (44 mL). Lifestyle Do not use any products that contain nicotine or tobacco. These products include cigarettes, chewing tobacco, and vaping devices, such as e-cigarettes. If you need help quitting, ask your health care provider. Do not use street drugs. Do not share needles. Ask your health care provider for help  if you need support or information about quitting drugs. General instructions Schedule regular health, dental, and eye exams. Stay current with your vaccines. Tell your health care provider if: You often feel depressed. You have ever been abused or do not feel safe at home. Summary Adopting a healthy lifestyle and getting preventive care are important in promoting health and wellness. Follow your health care provider's instructions about healthy diet, exercising, and getting tested or screened for diseases. Follow your health care provider's instructions on monitoring your cholesterol and blood pressure. This information is not intended to replace advice given to you by your health care provider. Make sure you discuss any questions you have with your health care provider. Document Revised: 08/30/2020 Document Reviewed: 08/30/2020 Elsevier Patient Education  2024 [employer].

## 2022-10-03 NOTE — Progress Notes (Signed)
MEDICARE ANNUAL WELLNESS VISIT  10/03/2022  Telephone Visit Disclaimer This Medicare AWV was conducted by telephone due to national recommendations for restrictions regarding the COVID-19 Pandemic (e.g. social distancing).  I verified, using two identifiers, that I am speaking with Stefanie Wilson or their authorized healthcare agent. I discussed the limitations, risks, security, and privacy concerns of performing an evaluation and management service by telephone and the potential availability of an in-person appointment in the future. The patient expressed understanding and agreed to proceed.  Location of Patient: Home Location of Provider (nurse):  In the office.  Subjective:    Stefanie Wilson is a 72 y.o. female patient of Christen Butter, NP who had a Medicare Annual Wellness Visit today via telephone. Stefanie Wilson is Retired and lives with their son. she has 6 children. she reports that she is socially active and does interact with friends/family regularly. she is moderately physically active and enjoys watching television.  Patient Care Team: Christen Butter, NP as PCP - General (Nurse Practitioner)     10/03/2022    2:03 PM 02/07/2021    2:05 PM 11/22/2020   11:06 AM  Advanced Directives  Does Patient Have a Medical Advance Directive? No Yes Yes  Type of Advance Directive  Living will;Healthcare Power of Attorney Living will  Does patient want to make changes to medical advance directive?  No - Patient declined No - Patient declined  Copy of Healthcare Power of Attorney in Chart?  No - copy requested   Would patient like information on creating a medical advance directive? No - Patient declined      Hospital Utilization Over the Past 12 Months: # of hospitalizations or ER visits: 0 # of surgeries: 0  Review of Systems    Patient reports that her overall health is unchanged compared to last year.  History obtained from chart review and the patient  Patient Reported Readings (BP,  Pulse, CBG, Weight, etc) none  Pain Assessment Pain : No/denies pain     Current Medications & Allergies (verified) Allergies as of 10/03/2022       Reactions   Gabapentin Other (See Comments)   Felt jumpy, worsened RLS symptoms.         Medication List        Accurate as of October 03, 2022  2:20 PM. If you have any questions, ask your nurse or doctor.          atorvastatin 40 MG tablet Commonly known as: LIPITOR TAKE 1 TABLET EVERY DAY   lisinopril 5 MG tablet Commonly known as: ZESTRIL Take 1 tablet (5 mg total) by mouth daily.   rOPINIRole 0.5 MG tablet Commonly known as: REQUIP TAKE 1 TABLET BY MOUTH AT BEDTIME        History (reviewed): History reviewed. No pertinent past medical history. History reviewed. No pertinent surgical history. Family History  Problem Relation Age of Onset   Heart attack Father    Breast cancer Sister    Cancer Sister        Breast   Breast cancer Paternal Aunt    Breast cancer Cousin    Social History   Socioeconomic History   Marital status: Widowed    Spouse name: Not on file   Number of children: 6   Years of education: 61   Highest education level: 11th grade  Occupational History   Occupation: Retired  Tobacco Use   Smoking status: Every Day    Packs/day: 0.50    Years:  50.00    Additional pack years: 0.00    Total pack years: 25.00    Types: Cigarettes   Smokeless tobacco: Never  Vaping Use   Vaping Use: Never used  Substance and Sexual Activity   Alcohol use: No   Drug use: No   Sexual activity: Not on file  Other Topics Concern   Not on file  Social History Narrative   Lives with her son. She had 6 children and one has passed away.She recently lost her husband (May, 2022). She enjoys watching television in her free time.   Social Determinants of Health   Financial Resource Strain: Low Risk  (10/03/2022)   Overall Financial Resource Strain (CARDIA)    Difficulty of Paying Living Expenses: Not  hard at all  Food Insecurity: No Food Insecurity (10/03/2022)   Hunger Vital Sign    Worried About Running Out of Food in the Last Year: Never true    Ran Out of Food in the Last Year: Never true  Transportation Needs: No Transportation Needs (10/03/2022)   PRAPARE - Administrator, Civil Service (Medical): No    Lack of Transportation (Non-Medical): No  Physical Activity: Insufficiently Active (10/03/2022)   Exercise Vital Sign    Days of Exercise per Week: 3 days    Minutes of Exercise per Session: 30 min  Stress: No Stress Concern Present (10/03/2022)   Stefanie Wilson [employer] - Occupational Stress Questionnaire    Feeling of Stress : Not at all  Social Connections: Moderately Isolated (10/03/2022)   Social Connection and Isolation Panel [NHANES]    Frequency of Communication with Friends and Family: More than three times a week    Frequency of Social Gatherings with Friends and Family: More than three times a week    Attends Religious Services: More than 4 times per year    Active Member of [employer] or Organizations: No    Attends Banker Meetings: Never    Marital Status: Widowed    Activities of Daily Living    10/03/2022    2:08 PM  In your present state of health, do you have any difficulty performing the following activities:  Hearing? 1  Comment bilateral hearing aids  Vision? 1  Comment some blurred vision; starting shots in her eyes soon.  Difficulty concentrating or making decisions? 0  Walking or climbing stairs? 0  Dressing or bathing? 0  Doing errands, shopping? 1  Comment she does not drive  Preparing Food and eating ? N  Using the Toilet? N  In the past six months, have you accidently leaked urine? N  Do you have problems with loss of bowel control? N  Managing your Medications? N  Managing your Finances? N  Housekeeping or managing your Housekeeping? N    Patient Education/ Literacy How often do you need to have  someone help you when you read instructions, pamphlets, or other written materials from your doctor or pharmacy?: 1 - Never What is the last grade level you completed in school?: 11th grade  Exercise Current Exercise Habits: Home exercise routine, Type of exercise: walking, Time (Minutes): 30, Frequency (Times/Week): 3, Weekly Exercise (Minutes/Week): 90, Intensity: Moderate, Exercise limited by: None identified  Diet Patient reports consuming 3 meals a day and 0 snack(s) a day Patient reports that her primary diet is: Regular Patient reports that she does have regular access to food.   Depression Screen    10/03/2022    2:04 PM 08/10/2022  11:23 AM 02/07/2022   10:04 AM 02/07/2021    2:06 PM 01/25/2021   10:16 AM 11/22/2020   11:03 AM  PHQ 2/9 Scores  PHQ - 2 Score 0 0 0 0 0 0  PHQ- 9 Score      3     Fall Risk    10/03/2022    2:04 PM 08/10/2022   11:23 AM 02/07/2022   10:04 AM 02/07/2021    2:06 PM 01/25/2021   10:16 AM  Fall Risk   Falls in the past year? 0 0 0 0 0  Number falls in past yr: 0 1 0 0 0  Injury with Fall? 0 0 0 0 0  Risk for fall due to : No Fall Risks No Fall Risks No Fall Risks No Fall Risks   Follow up Falls evaluation completed Falls evaluation completed Falls evaluation completed Falls evaluation completed Falls evaluation completed     Objective:  Stefanie Wilson seemed alert and oriented and she participated appropriately during our telephone visit.  Blood Pressure Weight BMI  BP Readings from Last 3 Encounters:  08/10/22 (!) 152/86  02/07/22 (!) 143/84  07/26/21 112/77   Wt Readings from Last 3 Encounters:  08/10/22 165 lb 3.2 oz (74.9 kg)  02/07/22 173 lb 0.6 oz (78.5 kg)  07/26/21 167 lb 1.9 oz (75.8 kg)   BMI Readings from Last 1 Encounters:  08/10/22 30.22 kg/m    *Unable to obtain current vital signs, weight, and BMI due to telephone visit type  Hearing/Vision  Stefanie Wilson did not seem to have difficulty with hearing/understanding during  the telephone conversation Reports that she has had a formal eye exam by an eye care professional within the past year Reports that she has had a formal hearing evaluation within the past year *Unable to fully assess hearing and vision during telephone visit type  Cognitive Function:    10/03/2022    2:10 PM 02/07/2021    2:13 PM  6CIT Screen  What Year? 0 points 0 points  What month? 0 points 0 points  What time? 0 points 0 points  Count back from 20 0 points 0 points  Months in reverse 0 points 4 points  Repeat phrase 0 points 4 points  Total Score 0 points 8 points   (Normal:0-7, Significant for Dysfunction: >8)  Normal Cognitive Function Screening: Yes   Immunization & Health Maintenance Record  There is no immunization history on file for this patient.  Health Maintenance  Topic Date Due   DTaP/Tdap/Td (1 - Tdap) Never done   OPHTHALMOLOGY EXAM  10/03/2022 (Originally 04/08/2022)   Zoster Vaccines- Shingrix (1 of 2) 01/03/2023 (Originally 10/09/2000)   Pneumonia Vaccine 96+ Years old (1 of 2 - PCV) 02/08/2023 (Originally 10/09/1956)   Lung Cancer Screening  08/10/2023 (Originally 10/09/2000)   INFLUENZA VACCINE  11/23/2022   FOOT EXAM  02/08/2023   HEMOGLOBIN A1C  02/09/2023   MAMMOGRAM  03/17/2023   Diabetic kidney evaluation - eGFR measurement  08/10/2023   Diabetic kidney evaluation - Urine ACR  08/10/2023   Medicare Annual Wellness (AWV)  10/03/2023   DEXA SCAN  03/16/2024   Fecal DNA (Cologuard)  08/30/2025   HPV VACCINES  Aged Out   COVID-19 Vaccine  Discontinued   Hepatitis C Screening  Discontinued       Assessment  This is a routine wellness examination for [employer].  Health Maintenance: Due or Overdue Health Maintenance Due  Topic Date Due   DTaP/Tdap/Td (1 -  Tdap) Never done    Stefanie Wilson does not need a referral for Community Assistance: Care Management:   no Social Work:    no Prescription Assistance:  no Nutrition/Diabetes  Education:  no   Plan:  Personalized Goals  Goals Addressed               This Visit's Progress     Patient Stated (pt-stated)        Patient stated that she would like to continue to maintain her current lifestyle.       Personalized Health Maintenance & Screening Recommendations  Pneumococcal vaccine  Td vaccine Eye exam - please have those records faxed.  Shingles vaccine Mammogram-referral sent.  Patient declined the vaccines at this time.  Lung Cancer Screening Recommended: yes, patient declined. (Low Dose CT Chest recommended if Age 54-80 years, 20 pack-year currently smoking OR have quit w/in past 15 years) Hepatitis C Screening recommended: no HIV Screening recommended: no  Advanced Directives: Written information was not prepared per patient's request.  Referrals & Orders Orders Placed This Encounter  Procedures   Mammogram 3D SCREEN BREAST BILATERAL    Follow-up Plan Follow-up with Christen Butter, NP as planned Eye exam - please have those records faxed.  Medicare wellness visit in one year.  AVS printed and mailed to the patient.   I have personally reviewed and noted the following in the patient's chart:   Medical and social history Use of alcohol, tobacco or illicit drugs  Current medications and supplements Functional ability and status Nutritional status Physical activity Advanced directives List of other physicians Hospitalizations, surgeries, and ER visits in previous 12 months Vitals Screenings to include cognitive, depression, and falls Referrals and appointments  In addition, I have reviewed and discussed with Stefanie Wilson certain preventive protocols, quality metrics, and best practice recommendations. A written personalized care plan for preventive services as well as general preventive health recommendations is available and can be mailed to the patient at her request.      Modesto Charon, RN BSN  10/03/2022

## 2022-10-19 ENCOUNTER — Other Ambulatory Visit: Payer: Self-pay | Admitting: Medical-Surgical

## 2022-11-21 DIAGNOSIS — H353231 Exudative age-related macular degeneration, bilateral, with active choroidal neovascularization: Secondary | ICD-10-CM | POA: Diagnosis not present

## 2022-11-21 DIAGNOSIS — H43813 Vitreous degeneration, bilateral: Secondary | ICD-10-CM | POA: Diagnosis not present

## 2022-11-21 DIAGNOSIS — H35423 Microcystoid degeneration of retina, bilateral: Secondary | ICD-10-CM | POA: Diagnosis not present

## 2022-11-21 DIAGNOSIS — H43393 Other vitreous opacities, bilateral: Secondary | ICD-10-CM | POA: Diagnosis not present

## 2022-11-21 LAB — HM DIABETES EYE EXAM

## 2022-12-28 DIAGNOSIS — R000 Tachycardia, unspecified: Secondary | ICD-10-CM | POA: Diagnosis not present

## 2022-12-28 DIAGNOSIS — N390 Urinary tract infection, site not specified: Secondary | ICD-10-CM | POA: Diagnosis not present

## 2022-12-28 DIAGNOSIS — R319 Hematuria, unspecified: Secondary | ICD-10-CM | POA: Diagnosis not present

## 2022-12-28 DIAGNOSIS — Z888 Allergy status to other drugs, medicaments and biological substances status: Secondary | ICD-10-CM | POA: Diagnosis not present

## 2022-12-28 DIAGNOSIS — R509 Fever, unspecified: Secondary | ICD-10-CM | POA: Diagnosis not present

## 2022-12-28 DIAGNOSIS — F17210 Nicotine dependence, cigarettes, uncomplicated: Secondary | ICD-10-CM | POA: Diagnosis not present

## 2022-12-28 DIAGNOSIS — R059 Cough, unspecified: Secondary | ICD-10-CM | POA: Diagnosis not present

## 2022-12-28 DIAGNOSIS — U071 COVID-19: Secondary | ICD-10-CM | POA: Diagnosis not present

## 2022-12-28 DIAGNOSIS — R918 Other nonspecific abnormal finding of lung field: Secondary | ICD-10-CM | POA: Diagnosis not present

## 2022-12-29 ENCOUNTER — Telehealth: Payer: Self-pay | Admitting: Medical-Surgical

## 2022-12-29 MED ORDER — CEPHALEXIN 500 MG PO CAPS: 500.0000 mg | ORAL_CAPSULE | Freq: Four times a day (QID) | ORAL | 0 refills | Status: DC

## 2022-12-29 NOTE — Telephone Encounter (Signed)
Discontinue Vantin.  Switch to Keflex 500 mg 4 times daily for 5 days.  Continue doxycycline and Paxlovid as prescribed.

## 2022-12-29 NOTE — Telephone Encounter (Signed)
Spoke to patients daughter, [name] and advised her that you sent in a different medication.

## 2022-12-29 NOTE — Telephone Encounter (Signed)
I contacted Stefanie Wilson's daughter and she stated that the pharmacy tried the GoodRx for the Cefpodoxine and it was $66. Is there something else that you can call in for her. She was able to get the Paxlovid and the Doxycycline.

## 2022-12-29 NOTE — Telephone Encounter (Signed)
Patients daughter called in stating that the patient was seen in the ER and was dx with COVID, pneumonia and UTI.  She was prescribed Cestodoxine and Doxycyline but the patient can not afford the medication. Daughter was told to call PCP to see if something else could be prescribed, at least for the UTI. Please Advise. Advise daughter that PCP may not prescribe medication since she was not seen by PCP with these problems.

## 2023-01-11 ENCOUNTER — Encounter: Payer: Self-pay | Admitting: Medical-Surgical

## 2023-01-16 DIAGNOSIS — H35423 Microcystoid degeneration of retina, bilateral: Secondary | ICD-10-CM | POA: Diagnosis not present

## 2023-01-16 DIAGNOSIS — H353231 Exudative age-related macular degeneration, bilateral, with active choroidal neovascularization: Secondary | ICD-10-CM | POA: Diagnosis not present

## 2023-01-16 DIAGNOSIS — H43813 Vitreous degeneration, bilateral: Secondary | ICD-10-CM | POA: Diagnosis not present

## 2023-01-16 DIAGNOSIS — H31113 Age-related choroidal atrophy, bilateral: Secondary | ICD-10-CM | POA: Diagnosis not present

## 2023-01-16 DIAGNOSIS — H43393 Other vitreous opacities, bilateral: Secondary | ICD-10-CM | POA: Diagnosis not present

## 2023-01-26 DIAGNOSIS — R911 Solitary pulmonary nodule: Secondary | ICD-10-CM | POA: Diagnosis not present

## 2023-01-26 DIAGNOSIS — I2510 Atherosclerotic heart disease of native coronary artery without angina pectoris: Secondary | ICD-10-CM | POA: Diagnosis not present

## 2023-01-26 DIAGNOSIS — J432 Centrilobular emphysema: Secondary | ICD-10-CM | POA: Diagnosis not present

## 2023-01-26 DIAGNOSIS — R918 Other nonspecific abnormal finding of lung field: Secondary | ICD-10-CM | POA: Diagnosis not present

## 2023-01-31 DIAGNOSIS — R918 Other nonspecific abnormal finding of lung field: Secondary | ICD-10-CM | POA: Diagnosis not present

## 2023-01-31 DIAGNOSIS — F17210 Nicotine dependence, cigarettes, uncomplicated: Secondary | ICD-10-CM | POA: Diagnosis not present

## 2023-02-07 ENCOUNTER — Ambulatory Visit: Payer: Medicare HMO | Admitting: Medical-Surgical

## 2023-02-07 NOTE — Progress Notes (Deleted)
        Established patient visit  History, exam, impression, and plan:  No problem-specific Assessment & Plan notes found for this encounter.   Procedures performed this visit: None.  No follow-ups on file.  __________________________________ Thayer Ohm, DNP, APRN, FNP-BC Primary Care and Sports Medicine [facility] [place]

## 2023-02-09 ENCOUNTER — Ambulatory Visit (INDEPENDENT_AMBULATORY_CARE_PROVIDER_SITE_OTHER): Payer: Medicare HMO | Admitting: Medical-Surgical

## 2023-02-09 ENCOUNTER — Encounter: Payer: Self-pay | Admitting: Medical-Surgical

## 2023-02-09 VITALS — BP 123/72 | HR 79 | Resp 20 | Ht 62.0 in | Wt 166.1 lb

## 2023-02-09 DIAGNOSIS — E782 Mixed hyperlipidemia: Secondary | ICD-10-CM

## 2023-02-09 DIAGNOSIS — E119 Type 2 diabetes mellitus without complications: Secondary | ICD-10-CM | POA: Diagnosis not present

## 2023-02-09 DIAGNOSIS — H353231 Exudative age-related macular degeneration, bilateral, with active choroidal neovascularization: Secondary | ICD-10-CM

## 2023-02-09 DIAGNOSIS — G2581 Restless legs syndrome: Secondary | ICD-10-CM | POA: Diagnosis not present

## 2023-02-09 DIAGNOSIS — R030 Elevated blood-pressure reading, without diagnosis of hypertension: Secondary | ICD-10-CM

## 2023-02-09 DIAGNOSIS — Z794 Long term (current) use of insulin: Secondary | ICD-10-CM

## 2023-02-09 LAB — POCT GLYCOSYLATED HEMOGLOBIN (HGB A1C)
HbA1c, POC (controlled diabetic range): 6.2 % (ref 0.0–7.0)
Hemoglobin A1C: 6.2 % — AB (ref 4.0–5.6)

## 2023-02-09 MED ORDER — LISINOPRIL 5 MG PO TABS
5.0000 mg | ORAL_TABLET | Freq: Every day | ORAL | 0 refills | Status: DC
Start: 2023-02-09 — End: 2023-02-12

## 2023-02-09 NOTE — Progress Notes (Signed)
        Established patient visit  History, exam, impression, and plan:  1. Type 2 diabetes mellitus without complication, with long-term current use of insulin [facility]) Replacement 72 year old female presents today for follow-up on type 2 diabetes.  She has been diet and lifestyle controlled with her last A1c at 6.3%.  Continues to aim for a low-fat heart healthy diet and stays active as tolerated.  Does not do any regular intentional exercise but also does not sit around for long periods of time with nothing to do.  Recheck of hemoglobin A1c today at 6.2%, still very well-controlled.  No need for further intervention at this time. - POCT HgB A1C  2. Mixed hyperlipidemia She is taking Lipitor 40 mg daily, tolerating well without side effects.  Following a low-fat heart healthy diet.  Activity as noted above.  Up-to-date on blood work.  Continue atorvastatin as prescribed.  3. Restless leg History of restless leg syndrome that was previously extremely difficult to manage.  She was taking Requip 0.5 mg at bedtime which seem to help with her symptoms.  Today, she reports that over the summer, she started using the ceiling fan at night and this has completely resolved her restless leg symptoms.  She has not been taking Requip and does not have any interest in keeping it on hand for as needed use.  4. Exudative age-related macular degeneration, bilateral, with active choroidal neovascularization (HCC) She is following very closely by ophthalmology and is doing eye injections for management of her condition.  5. Elevated blood pressure reading Her blood pressure has been elevated recently but she also is diabetic.  She was started on lisinopril 5 mg daily and has been taking this regularly, tolerating well without side effects.  Notes that she was getting this from the mail order pharmacy but they have not been sending her refills appropriately.  She only has 1 tablet left and would like 30-day emergency  supply sent to [facility] so she will not run out.  She is not checking blood pressures at home but does follow a low-sodium heart healthy diet.  Denies any concerning symptoms today.  Blood pressure at goal.  Cardiopulmonary exam benign.  Continue lisinopril 5 mg daily. - lisinopril (ZESTRIL) 5 MG tablet; Take 1 tablet (5 mg total) by mouth daily.  Dispense: 30 tablet; Refill: 0   Procedures performed this visit: None.  Return in about 6 months (around 08/10/2023) for chronic disease follow up.  __________________________________ Thayer Ohm, DNP, APRN, FNP-BC Primary Care and Sports Medicine [facility] [place]

## 2023-02-12 ENCOUNTER — Other Ambulatory Visit: Payer: Self-pay | Admitting: Medical-Surgical

## 2023-02-12 DIAGNOSIS — R030 Elevated blood-pressure reading, without diagnosis of hypertension: Secondary | ICD-10-CM

## 2023-02-12 MED ORDER — LISINOPRIL 5 MG PO TABS: 5.0000 mg | ORAL_TABLET | Freq: Every day | ORAL | 3 refills | Status: DC

## 2023-03-13 DIAGNOSIS — H353231 Exudative age-related macular degeneration, bilateral, with active choroidal neovascularization: Secondary | ICD-10-CM | POA: Diagnosis not present

## 2023-03-13 DIAGNOSIS — H43813 Vitreous degeneration, bilateral: Secondary | ICD-10-CM | POA: Diagnosis not present

## 2023-03-13 DIAGNOSIS — H43393 Other vitreous opacities, bilateral: Secondary | ICD-10-CM | POA: Diagnosis not present

## 2023-03-13 DIAGNOSIS — H35423 Microcystoid degeneration of retina, bilateral: Secondary | ICD-10-CM | POA: Diagnosis not present

## 2023-03-13 DIAGNOSIS — H31113 Age-related choroidal atrophy, bilateral: Secondary | ICD-10-CM | POA: Diagnosis not present

## 2023-03-15 ENCOUNTER — Other Ambulatory Visit: Payer: Self-pay | Admitting: Medical-Surgical

## 2023-04-04 IMAGING — MG MM DIGITAL SCREENING BILAT W/ TOMO AND CAD
8 series · 8 of 24 positions shown · non-contrast
Comparison: None.

CLINICAL DATA: Screening.

EXAM:
DIGITAL SCREENING BILATERAL MAMMOGRAM WITH TOMOSYNTHESIS AND CAD
TECHNIQUE: Bilateral screening digital craniocaudal and mediolateral oblique
mammograms were obtained. Bilateral screening digital breast
tomosynthesis was performed. The images were evaluated with
computer-aided detection.

[L MLO synth-2D]
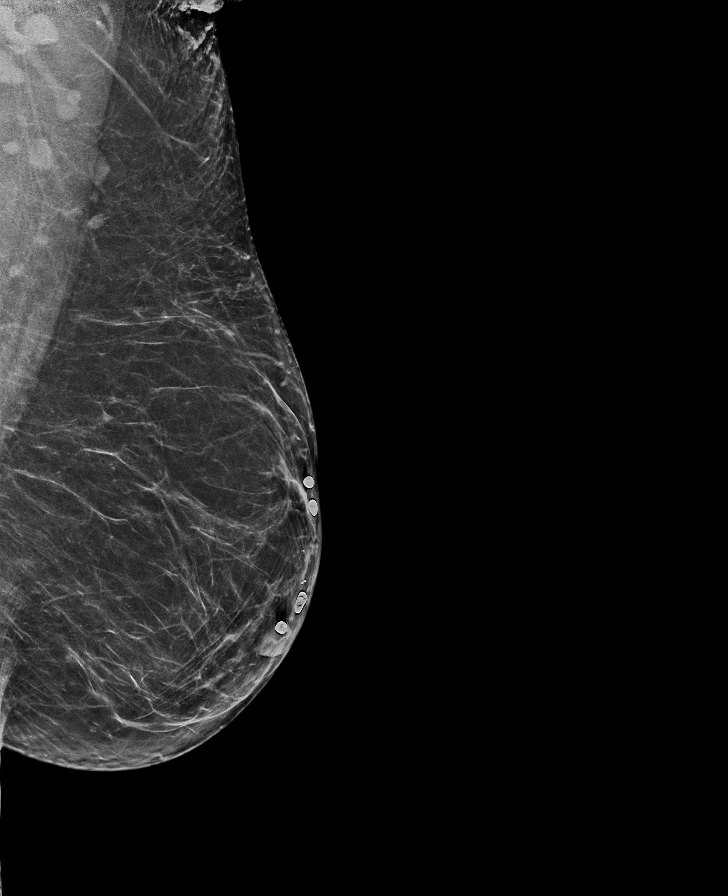

[R MLO synth-2D]
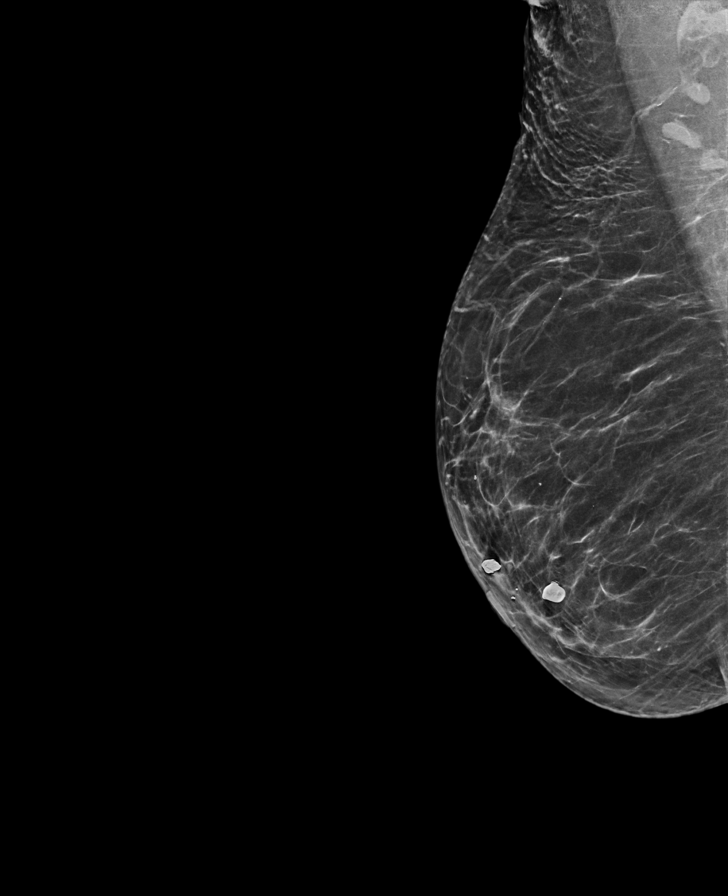

[R CC synth-2D]
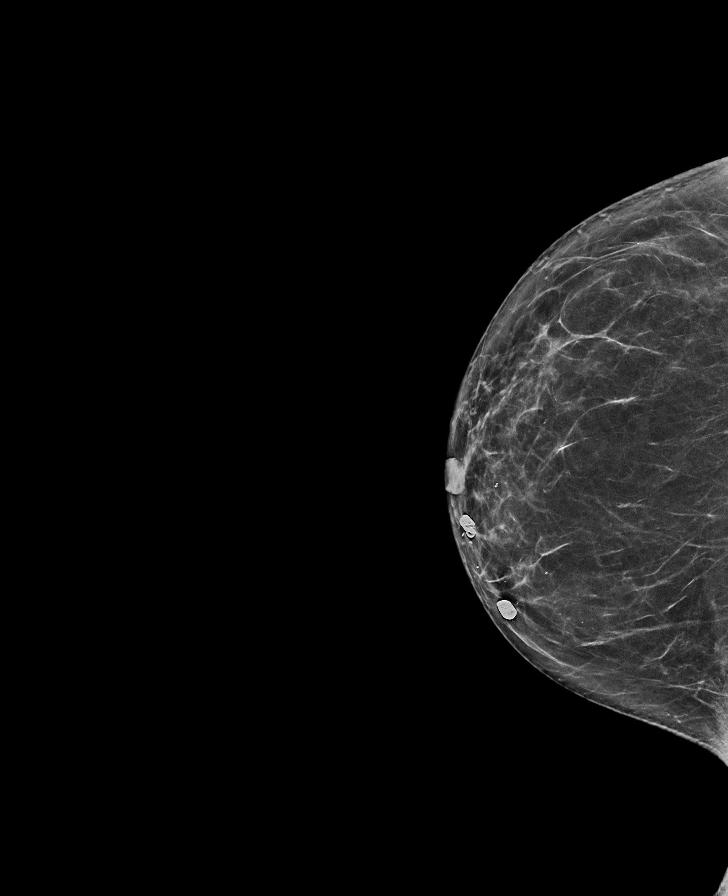

[L CC synth-2D]
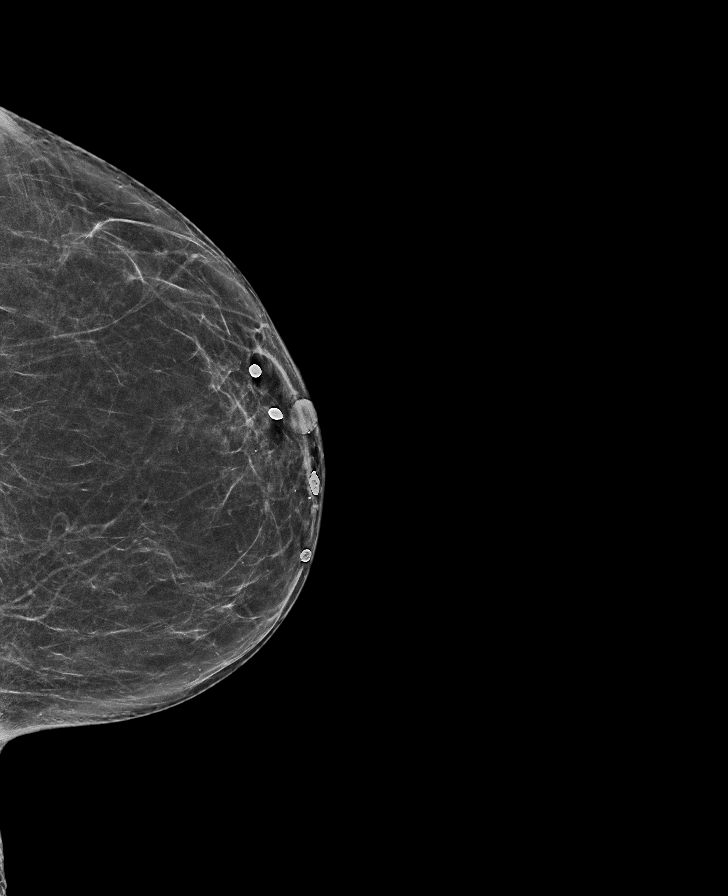

[L MLO tomo · tomo slice 36/71.0]
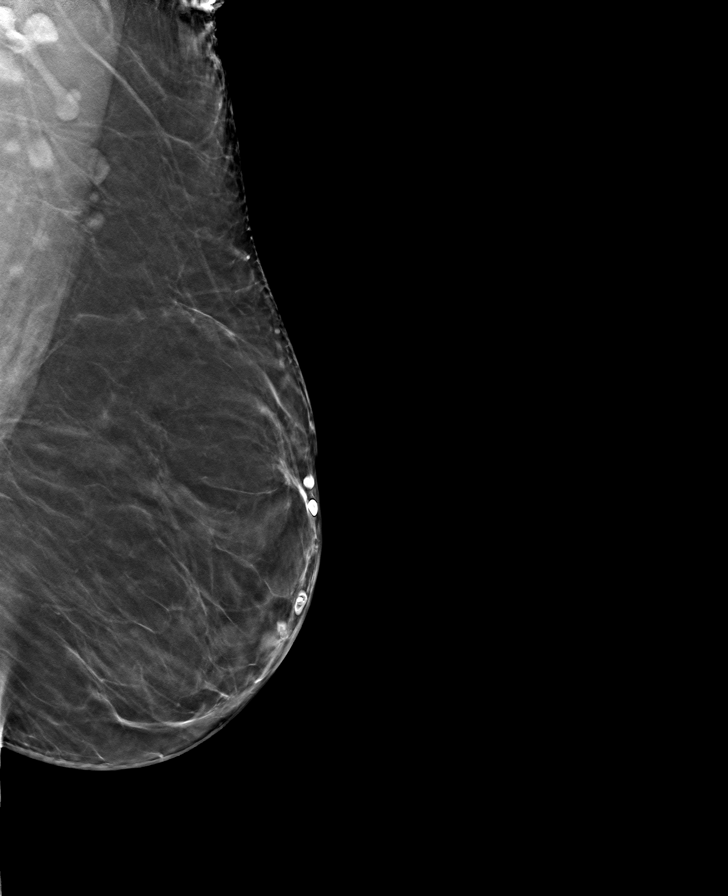

[R MLO tomo · tomo slice 34/67.0]
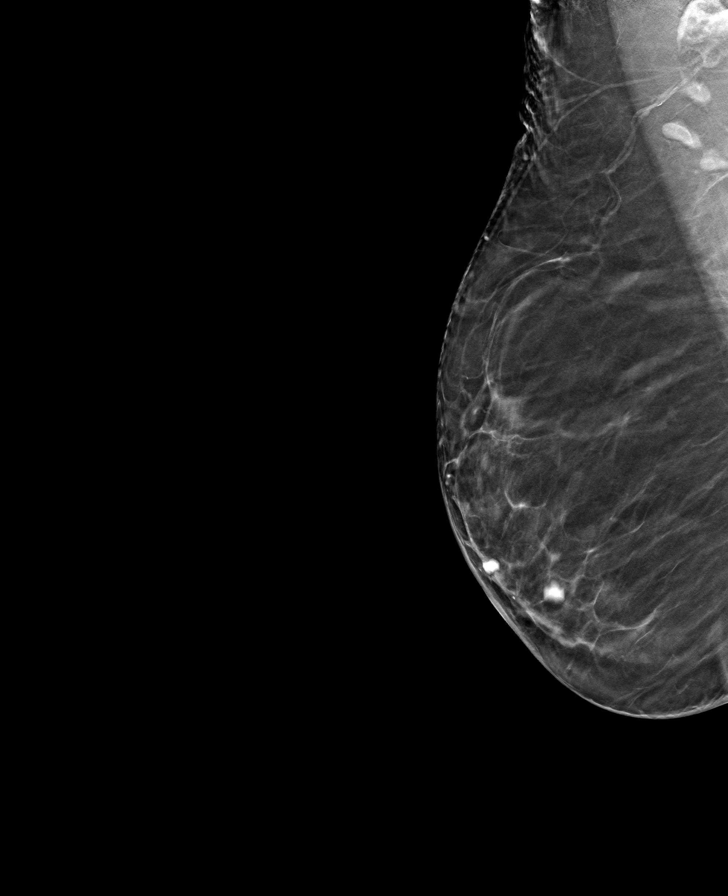

[R CC tomo · tomo slice 34/67.0]
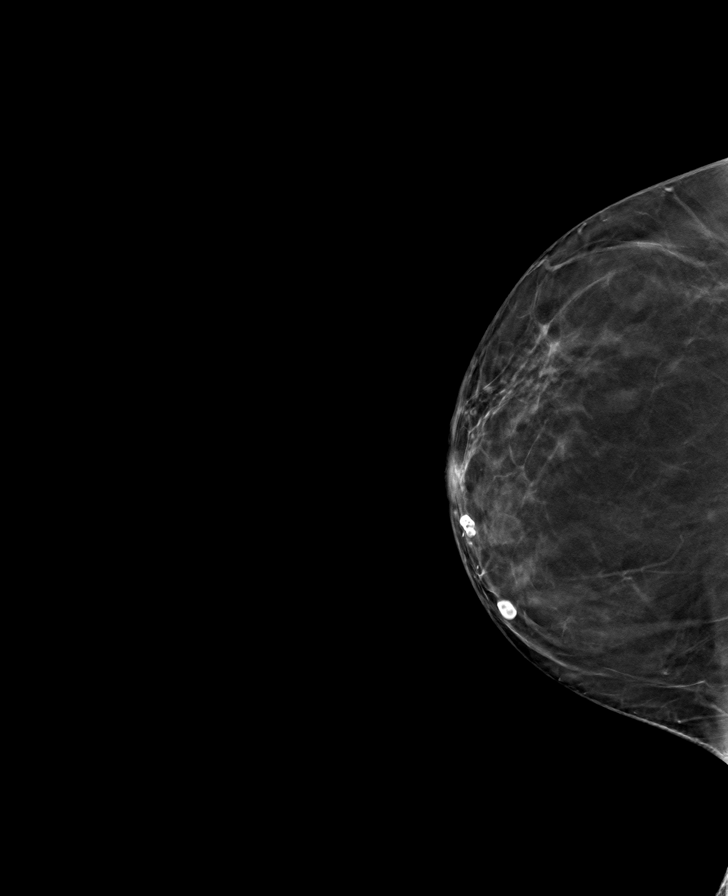

[L CC tomo · tomo slice 33/64.0]
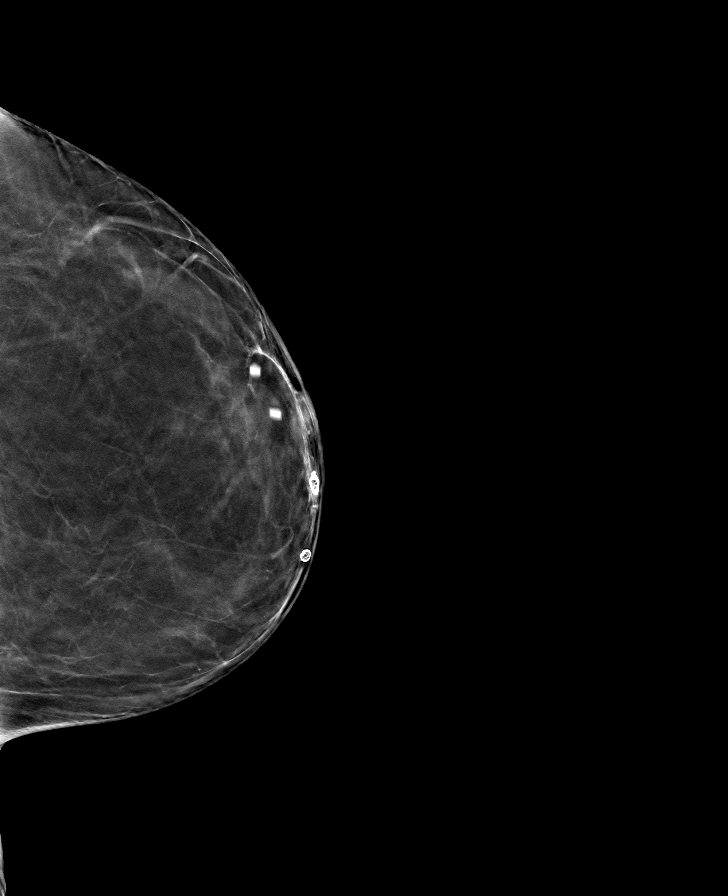

[8 of 24 positions shown; findings below may reference images not displayed]

ACR Breast Density Category b: There are scattered areas of
fibroglandular density.
FINDINGS: There are no findings suspicious for malignancy.
IMPRESSION: No mammographic evidence of malignancy. A result letter of this
screening mammogram will be mailed directly to the patient.

RECOMMENDATION:
Screening mammogram in one year. (Code:[id number]-X-X7B)

BI-RADS CATEGORY  1: Negative.

## 2023-05-18 DIAGNOSIS — H43813 Vitreous degeneration, bilateral: Secondary | ICD-10-CM | POA: Diagnosis not present

## 2023-05-18 DIAGNOSIS — H31113 Age-related choroidal atrophy, bilateral: Secondary | ICD-10-CM | POA: Diagnosis not present

## 2023-05-18 DIAGNOSIS — H353231 Exudative age-related macular degeneration, bilateral, with active choroidal neovascularization: Secondary | ICD-10-CM | POA: Diagnosis not present

## 2023-05-18 DIAGNOSIS — H43393 Other vitreous opacities, bilateral: Secondary | ICD-10-CM | POA: Diagnosis not present

## 2023-05-18 DIAGNOSIS — H35423 Microcystoid degeneration of retina, bilateral: Secondary | ICD-10-CM | POA: Diagnosis not present

## 2023-07-06 DIAGNOSIS — H31113 Age-related choroidal atrophy, bilateral: Secondary | ICD-10-CM | POA: Diagnosis not present

## 2023-07-06 DIAGNOSIS — H35423 Microcystoid degeneration of retina, bilateral: Secondary | ICD-10-CM | POA: Diagnosis not present

## 2023-07-06 DIAGNOSIS — H43393 Other vitreous opacities, bilateral: Secondary | ICD-10-CM | POA: Diagnosis not present

## 2023-07-06 DIAGNOSIS — H353231 Exudative age-related macular degeneration, bilateral, with active choroidal neovascularization: Secondary | ICD-10-CM | POA: Diagnosis not present

## 2023-07-06 DIAGNOSIS — H43813 Vitreous degeneration, bilateral: Secondary | ICD-10-CM | POA: Diagnosis not present

## 2023-07-30 DIAGNOSIS — H903 Sensorineural hearing loss, bilateral: Secondary | ICD-10-CM | POA: Diagnosis not present

## 2023-08-08 ENCOUNTER — Other Ambulatory Visit: Payer: Self-pay | Admitting: Medical-Surgical

## 2023-08-10 ENCOUNTER — Ambulatory Visit: Payer: Medicare HMO | Admitting: Medical-Surgical

## 2023-08-13 ENCOUNTER — Ambulatory Visit (INDEPENDENT_AMBULATORY_CARE_PROVIDER_SITE_OTHER): Admitting: Medical-Surgical

## 2023-08-13 ENCOUNTER — Encounter: Payer: Self-pay | Admitting: Medical-Surgical

## 2023-08-13 VITALS — BP 137/77 | HR 78 | Wt 158.1 lb

## 2023-08-13 DIAGNOSIS — R030 Elevated blood-pressure reading, without diagnosis of hypertension: Secondary | ICD-10-CM | POA: Diagnosis not present

## 2023-08-13 DIAGNOSIS — Z1231 Encounter for screening mammogram for malignant neoplasm of breast: Secondary | ICD-10-CM | POA: Diagnosis not present

## 2023-08-13 DIAGNOSIS — F17210 Nicotine dependence, cigarettes, uncomplicated: Secondary | ICD-10-CM | POA: Diagnosis not present

## 2023-08-13 DIAGNOSIS — N3001 Acute cystitis with hematuria: Secondary | ICD-10-CM

## 2023-08-13 DIAGNOSIS — E1169 Type 2 diabetes mellitus with other specified complication: Secondary | ICD-10-CM

## 2023-08-13 DIAGNOSIS — Z794 Long term (current) use of insulin: Secondary | ICD-10-CM | POA: Diagnosis not present

## 2023-08-13 DIAGNOSIS — M8580 Other specified disorders of bone density and structure, unspecified site: Secondary | ICD-10-CM | POA: Diagnosis not present

## 2023-08-13 DIAGNOSIS — E119 Type 2 diabetes mellitus without complications: Secondary | ICD-10-CM

## 2023-08-13 DIAGNOSIS — E785 Hyperlipidemia, unspecified: Secondary | ICD-10-CM

## 2023-08-13 LAB — POCT URINALYSIS DIP (CLINITEK)
Bilirubin, UA: NEGATIVE
Glucose, UA: NEGATIVE mg/dL
Ketones, POC UA: NEGATIVE mg/dL
Nitrite, UA: POSITIVE — AB
POC PROTEIN,UA: NEGATIVE
Spec Grav, UA: 1.01 (ref 1.010–1.025)
Urobilinogen, UA: 1 U/dL
pH, UA: 6 (ref 5.0–8.0)

## 2023-08-13 LAB — POCT GLYCOSYLATED HEMOGLOBIN (HGB A1C): Hemoglobin A1C: 6.1 % — AB (ref 4.0–5.6)

## 2023-08-13 LAB — POCT UA - MICROALBUMIN
Albumin/Creatinine Ratio, Urine, POC: <30
Creatinine, POC: 100 mg/dL
Microalbumin Ur, POC: 30 mg/L

## 2023-08-13 MED ORDER — LISINOPRIL 5 MG PO TABS: 5.0000 mg | ORAL_TABLET | Freq: Every day | ORAL | 3 refills | Status: DC

## 2023-08-13 MED ORDER — SULFAMETHOXAZOLE-TRIMETHOPRIM 800-160 MG PO TABS: 1.0000 | ORAL_TABLET | Freq: Two times a day (BID) | ORAL | 0 refills | Status: DC

## 2023-08-13 MED ORDER — ATORVASTATIN CALCIUM 40 MG PO TABS: 40.0000 mg | ORAL_TABLET | Freq: Every day | ORAL | 3 refills | Status: AC

## 2023-08-13 NOTE — Progress Notes (Signed)
        Established patient visit  History, exam, impression, and plan:  1. Type 2 diabetes mellitus without complication, with long-term current use of insulin (HCC) (Primary) Very pleasant 73 year old female presenting today for follow-up on type 2 diabetes.  She is not currently medicated and is considered diet and lifestyle controlled.  Previous A1c 6.2%.  Recheck today at 6.1%.  Microalbumin normal today.  Diabetic foot exam completed, no areas of numbness or tingling.  Up-to-date on eye care.  Treated with lisinopril  and atorvastatin  per guidelines.  Continue working on a low-carb heart healthy diet and staying physically active. - POCT UA - Microalbumin - HM Diabetes Foot Exam - POCT HgB A1C  2. Encounter for screening mammogram for malignant neoplasm of breast Mammogram ordered. - MM DIGITAL SCREENING BILATERAL; Future  3. Hyperlipidemia associated with type 2 diabetes mellitus (HCC) Taking atorvastatin  as prescribed, tolerating well without side effects.  Following low-fat heart healthy diet.  Activity as tolerated but limited by orthopedic issues.  Checking labs today.  Continue atorvastatin . - CMP14+EGFR - Lipid panel  4. Cigarette smoker Long-term cigarette smoker.  Counseling provided today on smoking cessation but she is not ready to quit.  She is followed by pulmonology and is up-to-date on lung cancer screening.  Checking CBC today. - CBC  5. Osteopenia, unspecified location History of osteopenia at last DEXA scan 2-3 years ago.  Repeating DEXA scan today. - DG Bone Density; Future  6. Elevated blood pressure reading Taking lisinopril  5 mg daily.  Blood pressure is well-controlled and denies concerning symptoms.  Medication is well-tolerated.  Not checking blood pressures at home.  Add salt to some foods but tries to limit it as much as possible.  Activity as noted above.  Continue lisinopril  5 mg daily. - lisinopril  (ZESTRIL ) 5 MG tablet; Take 1 tablet (5 mg total) by  mouth daily.  Dispense: 90 tablet; Refill: 3  7.  Acute cystitis with hematuria When analyzing urine for microalbumin, the urine was noted to be very cloudy and had a very strong odor.  POCT urinalysis shows positive for nitrites and moderate leukocytes with trace intact blood.  Treating with Bactrim  twice daily x 3 days.  Sending for culture.  Procedures performed this visit: None.  Return in about 6 months (around 02/12/2024) for chronic disease follow up.  __________________________________ Maryl Snook, DNP, APRN, FNP-BC Primary Care and Sports Medicine [facility] [place]

## 2023-08-14 ENCOUNTER — Encounter: Payer: Self-pay | Admitting: Medical-Surgical

## 2023-08-14 LAB — CBC
Hematocrit: 50.5 % — ABNORMAL HIGH (ref 34.0–46.6)
Hemoglobin: 16.7 g/dL — ABNORMAL HIGH (ref 11.1–15.9)
MCH: 30.4 pg (ref 26.6–33.0)
MCHC: 33.1 g/dL (ref 31.5–35.7)
MCV: 92 fL (ref 79–97)
Platelets: 257 10*3/uL (ref 150–450)
RBC: 5.5 x10E6/uL — ABNORMAL HIGH (ref 3.77–5.28)
RDW: 12.4 % (ref 11.7–15.4)
WBC: 8 10*3/uL (ref 3.4–10.8)

## 2023-08-14 LAB — CMP14+EGFR
ALT: 17 IU/L (ref 0–32)
AST: 20 IU/L (ref 0–40)
Albumin: 4 g/dL (ref 3.8–4.8)
Alkaline Phosphatase: 92 IU/L (ref 44–121)
BUN/Creatinine Ratio: 10 — ABNORMAL LOW (ref 12–28)
BUN: 8 mg/dL (ref 8–27)
Bilirubin Total: 0.3 mg/dL (ref 0.0–1.2)
CO2: 21 mmol/L (ref 20–29)
Calcium: 8.7 mg/dL (ref 8.7–10.3)
Chloride: 107 mmol/L — ABNORMAL HIGH (ref 96–106)
Creatinine, Ser: 0.8 mg/dL (ref 0.57–1.00)
Globulin, Total: 2.7 g/dL (ref 1.5–4.5)
Glucose: 99 mg/dL (ref 70–99)
Potassium: 4.2 mmol/L (ref 3.5–5.2)
Sodium: 144 mmol/L (ref 134–144)
Total Protein: 6.7 g/dL (ref 6.0–8.5)
eGFR: 78 mL/min/{1.73_m2} (ref >59)

## 2023-08-14 LAB — LIPID PANEL
Chol/HDL Ratio: 3.5 ratio (ref 0.0–4.4)
Cholesterol, Total: 109 mg/dL (ref 100–199)
HDL: 31 mg/dL — ABNORMAL LOW (ref >39)
LDL Chol Calc (NIH): 52 mg/dL (ref 0–99)
Triglycerides: 150 mg/dL — ABNORMAL HIGH (ref 0–149)
VLDL Cholesterol Cal: 26 mg/dL (ref 5–40)

## 2023-08-16 ENCOUNTER — Encounter: Payer: Self-pay | Admitting: Medical-Surgical

## 2023-08-16 LAB — URINE CULTURE

## 2023-09-04 DIAGNOSIS — H43813 Vitreous degeneration, bilateral: Secondary | ICD-10-CM | POA: Diagnosis not present

## 2023-09-04 DIAGNOSIS — H31113 Age-related choroidal atrophy, bilateral: Secondary | ICD-10-CM | POA: Diagnosis not present

## 2023-09-04 DIAGNOSIS — H43393 Other vitreous opacities, bilateral: Secondary | ICD-10-CM | POA: Diagnosis not present

## 2023-09-04 DIAGNOSIS — H35423 Microcystoid degeneration of retina, bilateral: Secondary | ICD-10-CM | POA: Diagnosis not present

## 2023-09-04 DIAGNOSIS — H353231 Exudative age-related macular degeneration, bilateral, with active choroidal neovascularization: Secondary | ICD-10-CM | POA: Diagnosis not present

## 2023-10-03 ENCOUNTER — Ambulatory Visit

## 2023-10-03 ENCOUNTER — Ambulatory Visit: Payer: Self-pay | Admitting: Medical-Surgical

## 2023-10-03 ENCOUNTER — Encounter: Payer: Self-pay | Admitting: Medical-Surgical

## 2023-10-03 DIAGNOSIS — Z13820 Encounter for screening for osteoporosis: Secondary | ICD-10-CM

## 2023-10-03 DIAGNOSIS — Z1231 Encounter for screening mammogram for malignant neoplasm of breast: Secondary | ICD-10-CM | POA: Diagnosis not present

## 2023-10-03 DIAGNOSIS — M8580 Other specified disorders of bone density and structure, unspecified site: Secondary | ICD-10-CM | POA: Diagnosis not present

## 2023-10-03 DIAGNOSIS — M8589 Other specified disorders of bone density and structure, multiple sites: Secondary | ICD-10-CM | POA: Diagnosis not present

## 2023-10-03 DIAGNOSIS — Z780 Asymptomatic menopausal state: Secondary | ICD-10-CM | POA: Diagnosis not present

## 2023-10-09 ENCOUNTER — Ambulatory Visit (INDEPENDENT_AMBULATORY_CARE_PROVIDER_SITE_OTHER): Payer: Medicare HMO

## 2023-10-09 VITALS — Ht 61.0 in | Wt 154.0 lb

## 2023-10-09 DIAGNOSIS — Z0000 Encounter for general adult medical examination without abnormal findings: Secondary | ICD-10-CM | POA: Diagnosis not present

## 2023-10-09 NOTE — Progress Notes (Signed)
 Subjective:   Stefanie Wilson is a 73 y.o. female who presents for Medicare Annual (Subsequent) preventive examination.  Visit Complete: Virtual I connected with  [name] on 10/09/23 by a audio enabled telemedicine application and verified that I am speaking with the correct person using two identifiers.  Patient Location: Home  Provider Location: Office/Clinic  I discussed the limitations of evaluation and management by telemedicine. The patient expressed understanding and agreed to proceed.  Vital Signs: Because this visit was a virtual/telehealth visit, some criteria may be missing or patient reported. Any vitals not documented were not able to be obtained and vitals that have been documented are patient reported.  Patient Medicare AWV questionnaire was completed by the patient on n/a; I have confirmed that all information answered by patient is correct and no changes since this date.  Cardiac Risk Factors include: advanced age (>9men, >44 women);dyslipidemia;obesity (BMI >30kg/m2);smoking/ tobacco exposure;sedentary lifestyle;hypertension     Objective:    Today's Vitals   10/09/23 1546  Weight: 154 lb (69.9 kg)  Height: 5' 1 (1.549 m)   Body mass index is 29.1 kg/m.     10/09/2023    3:59 PM 10/03/2022    2:03 PM 02/07/2021    2:05 PM 11/22/2020   11:06 AM  Advanced Directives  Does Patient Have a Medical Advance Directive? Yes No Yes Yes  Type of Estate agent of [place];Living will  Living will;Healthcare Power of Attorney Living will  Does patient want to make changes to medical advance directive? No - Patient declined  No - Patient declined No - Patient declined  Copy of Healthcare Power of Attorney in Chart? No - copy requested  No - copy requested   Would patient like information on creating a medical advance directive?  No - Patient declined      Current Medications (verified) Outpatient Encounter Medications as of 10/09/2023   Medication Sig   atorvastatin  (LIPITOR) 40 MG tablet Take 1 tablet (40 mg total) by mouth daily.   lisinopril  (ZESTRIL ) 5 MG tablet Take 1 tablet (5 mg total) by mouth daily.   sulfamethoxazole -trimethoprim  (BACTRIM  DS) 800-160 MG tablet Take 1 tablet by mouth 2 (two) times daily.   No facility-administered encounter medications on file as of 10/09/2023.    Allergies (verified) Gabapentin    History: History reviewed. No pertinent past medical history. History reviewed. No pertinent surgical history. Family History  Problem Relation Age of Onset   Heart attack Father    Breast cancer Sister    Cancer Sister        Breast   Breast cancer Paternal Aunt    Breast cancer Cousin    Social History   Socioeconomic History   Marital status: Widowed    Spouse name: Not on file   Number of children: 6   Years of education: 64   Highest education level: 11th grade  Occupational History   Occupation: Retired  Tobacco Use   Smoking status: Every Day    Current packs/day: 0.50    Average packs/day: 0.5 packs/day for 50.0 years (25.0 ttl pk-yrs)    Types: Cigarettes   Smokeless tobacco: Never  Vaping Use   Vaping status: Never Used  Substance and Sexual Activity   Alcohol use: No   Drug use: No   Sexual activity: Not on file  Other Topics Concern   Not on file  Social History Narrative   Lives with her son. She had 6 children and one has passed away.She  recently lost her husband (May, 2022). She enjoys watching television in her free time.   Social Drivers of Corporate investment banker Strain: Low Risk  (10/09/2023)   Overall Financial Resource Strain (CARDIA)    Difficulty of Paying Living Expenses: Not hard at all  Food Insecurity: No Food Insecurity (10/09/2023)   Hunger Vital Sign    Worried About Running Out of Food in the Last Year: Never true    Ran Out of Food in the Last Year: Never true  Transportation Needs: No Transportation Needs (10/09/2023)   PRAPARE -  Administrator, Civil Service (Medical): No    Lack of Transportation (Non-Medical): No  Physical Activity: Insufficiently Active (10/09/2023)   Exercise Vital Sign    Days of Exercise per Week: 3 days    Minutes of Exercise per Session: 30 min  Stress: Stress Concern Present (10/09/2023)   [employer] of Occupational Health - Occupational Stress Questionnaire    Feeling of Stress: To some extent  Social Connections: Moderately Integrated (10/09/2023)   Social Connection and Isolation Panel    Frequency of Communication with Friends and Family: More than three times a week    Frequency of Social Gatherings with Friends and Family: More than three times a week    Attends Religious Services: More than 4 times per year    Active Member of [employer] or Organizations: Yes    Attends Banker Meetings: 1 to 4 times per year    Marital Status: Widowed    Tobacco Counseling Ready to quit: Not Answered Counseling given: Not Answered   Clinical Intake:  Pre-visit preparation completed: Yes  Pain : No/denies pain     BMI - recorded: 29.1 Nutritional Status: BMI 25 -29 Overweight Nutritional Risks: None Diabetes: No  How often do you need to have someone help you when you read instructions, pamphlets, or other written materials from your doctor or pharmacy?: 1 - Never What is the last grade level you completed in school?: 11  Interpreter Needed?: No      Activities of Daily Living    10/09/2023    3:48 PM  In your present state of health, do you have any difficulty performing the following activities:  Hearing? 1  Vision? 1  Difficulty concentrating or making decisions? 1  Walking or climbing stairs? 0  Dressing or bathing? 0  Doing errands, shopping? 0  Preparing Food and eating ? N  Using the Toilet? N  In the past six months, have you accidently leaked urine? Y  Do you have problems with loss of bowel control? N  Managing your Medications? N   Managing your Finances? N  Housekeeping or managing your Housekeeping? N    Patient Care Team: Cherre Cornish, NP as PCP - General (Nurse Practitioner) Javaid, Adnan, MD as Referring Physician (Pulmonary Disease)  Indicate any recent Medical Services you may have received from other than [facility] providers in the past year (date may be approximate).     Assessment:   This is a routine wellness examination for [place].  Hearing/Vision screen No results found.   Goals Addressed             This Visit's Progress    Patient Stated       Patient states she would like to increase water intake. She would like to stop smoking.        Depression Screen    10/09/2023    3:55 PM 08/13/2023  12:05 PM 10/03/2022    2:04 PM 08/10/2022   11:23 AM 02/07/2022   10:04 AM 02/07/2021    2:06 PM 01/25/2021   10:16 AM  PHQ 2/9 Scores  PHQ - 2 Score 4 3 0 0 0 0 0  PHQ- 9 Score 6 4         Fall Risk    10/09/2023    3:59 PM 08/13/2023   12:05 PM 10/03/2022    2:04 PM 08/10/2022   11:23 AM 02/07/2022   10:04 AM  Fall Risk   Falls in the past year? 0 0 0 0 0  Number falls in past yr: 0 0 0 1 0  Injury with Fall? 0 0 0 0 0  Risk for fall due to : No Fall Risks No Fall Risks No Fall Risks No Fall Risks No Fall Risks  Follow up Falls evaluation completed Falls evaluation completed Falls evaluation completed Falls evaluation completed Falls evaluation completed      Data saved with a previous flowsheet row definition    MEDICARE RISK AT HOME: Medicare Risk at Home Any stairs in or around the home?: Yes If so, are there any without handrails?: No Home free of loose throw rugs in walkways, pet beds, electrical cords, etc?: Yes Adequate lighting in your home to reduce risk of falls?: Yes Life alert?: No Use of a cane, walker or w/c?: No Grab bars in the bathroom?: No Shower chair or bench in shower?: No Elevated toilet seat or a handicapped toilet?: No  TIMED UP AND GO:  Was the test  performed?  No    Cognitive Function:        10/09/2023    4:00 PM 10/03/2022    2:10 PM 02/07/2021    2:13 PM  6CIT Screen  What Year?  0 points 0 points  What month?  0 points 0 points  What time? 0 points 0 points 0 points  Count back from 20 0 points 0 points 0 points  Months in reverse 4 points 0 points 4 points  Repeat phrase 0 points 0 points 4 points  Total Score  0 points 8 points    Immunizations  There is no immunization history on file for this patient.  TDAP status: Due, Education has been provided regarding the importance of this vaccine. Advised may receive this vaccine at local pharmacy or Health Dept. Aware to provide a copy of the vaccination record if obtained from local pharmacy or Health Dept. Verbalized acceptance and understanding.  Flu Vaccine status: Declined, Education has been provided regarding the importance of this vaccine but patient still declined. Advised may receive this vaccine at local pharmacy or Health Dept. Aware to provide a copy of the vaccination record if obtained from local pharmacy or Health Dept. Verbalized acceptance and understanding.  Pneumococcal vaccine status: Declined,  Education has been provided regarding the importance of this vaccine but patient still declined. Advised may receive this vaccine at local pharmacy or Health Dept. Aware to provide a copy of the vaccination record if obtained from local pharmacy or Health Dept. Verbalized acceptance and understanding.   Covid-19 vaccine status: Declined, Education has been provided regarding the importance of this vaccine but patient still declined. Advised may receive this vaccine at local pharmacy or Health Dept.or vaccine clinic. Aware to provide a copy of the vaccination record if obtained from local pharmacy or Health Dept. Verbalized acceptance and understanding.  Qualifies for Shingles Vaccine? Yes   Zostavax completed  No   Shingrix Completed?: No.    Education has been  provided regarding the importance of this vaccine. Patient has been advised to call insurance company to determine out of pocket expense if they have not yet received this vaccine. Advised may also receive vaccine at local pharmacy or Health Dept. Verbalized acceptance and understanding.  Screening Tests Health Maintenance  Topic Date Due   Lung Cancer Screening  Never done   Zoster Vaccines- Shingrix (1 of 2) Never done   DTaP/Tdap/Td (1 - Tdap) 02/09/2024 (Originally 10/09/1969)   Pneumococcal Vaccine: 50+ Years (1 of 2 - PCV) 02/09/2024 (Originally 10/09/1969)   OPHTHALMOLOGY EXAM  11/21/2023   INFLUENZA VACCINE  11/23/2023   HEMOGLOBIN A1C  02/12/2024   Diabetic kidney evaluation - eGFR measurement  08/12/2024   Diabetic kidney evaluation - Urine ACR  08/12/2024   FOOT EXAM  08/12/2024   Medicare Annual Wellness (AWV)  10/08/2024   Fecal DNA (Cologuard)  08/30/2025   MAMMOGRAM  10/02/2025   DEXA SCAN  10/03/2026   HPV VACCINES  Aged Out   Meningococcal B Vaccine  Aged Out   COVID-19 Vaccine  Discontinued   Hepatitis C Screening  Discontinued    Health Maintenance  Health Maintenance Due  Topic Date Due   Lung Cancer Screening  Never done   Zoster Vaccines- Shingrix (1 of 2) Never done    Colorectal cancer screening: Type of screening: Cologuard. Completed 08/31/2022. Repeat every 3 years  Mammogram status: Completed 10/03/2023. Repeat every year  Bone Density status: Completed 10/03/2023. Results reflect: Bone density results: OSTEOPOROSIS. Repeat every 2 years.  Lung Cancer Screening: (Low Dose CT Chest recommended if Age 69-80 years, 20 pack-year currently smoking OR have quit w/in 15years.) does qualify.   Lung Cancer Screening Referral: She in a program with [facility].   Additional Screening:  Hepatitis C Screening: does not qualify; Completed   Vision Screening: Recommended annual ophthalmology exams for early detection of glaucoma and other disorders of the eye. Is  the patient up to date with their annual eye exam?  Yes  Who is the provider or what is the name of the office in which the patient attends annual eye exams? Dr Margie Sheller If pt is not established with a provider, would they like to be referred to a provider to establish care? No .   Dental Screening: Recommended annual dental exams for proper oral hygiene  Diabetic Foot Exam: Diabetic Foot Exam: Completed 08/13/2023  Community Resource Referral / Chronic Care Management: CRR required this visit?  No   CCM required this visit?  No     Plan:     I have personally reviewed and noted the following in the patient's chart:   Medical and social history Use of alcohol, tobacco or illicit drugs  Current medications and supplements including opioid prescriptions. Patient is not currently taking opioid prescriptions. Functional ability and status Nutritional status Physical activity Advanced directives List of other physicians Hospitalizations, surgeries, and ER # 1 visits in previous 12 months Vitals Screenings to include cognitive, depression, and falls Referrals and appointments  In addition, I have reviewed and discussed with patient certain preventive protocols, quality metrics, and best practice recommendations. A written personalized care plan for preventive services as well as general preventive health recommendations were provided to patient.     Aubrey Leaf, CMA   10/09/2023   After Visit Summary: (Mail) Due to this being a telephonic visit, the after visit summary with patients personalized plan  was offered to patient via mail   Nurse Notes:    Llewellyn Schoenberger is a 73 y.o. female patient of Cherre Cornish, NP who had a Medicare Annual Wellness Visit today via telephone. Jesus is Retired and lives with their son. She has 6 children. She reports that she is socially active and does interact with friends/family regularly. She is moderately physically active and enjoys  watching television.

## 2023-10-09 NOTE — Patient Instructions (Signed)
  Stefanie Wilson , Thank you for taking time to come for your Medicare Wellness Visit. I appreciate your ongoing commitment to your health goals. Please review the following plan we discussed and let me know if I can assist you in the future.   These are the goals we discussed:  Goals       Patient Stated (pt-stated)      02/07/2021 AWV Goal: Exercise for General Health  Patient will verbalize understanding of the benefits of increased physical activity: Exercising regularly is important. It will improve your overall fitness, flexibility, and endurance. Regular exercise also will improve your overall health. It can help you control your weight, reduce stress, and improve your bone density. Over the next year, patient will increase physical activity as tolerated with a goal of at least 150 minutes of moderate physical activity per week.  You can tell that you are exercising at a moderate intensity if your heart starts beating faster and you start breathing faster but can still hold a conversation. Moderate-intensity exercise ideas include: Walking 1 mile (1.6 km) in about 15 minutes Biking Hiking Golfing Dancing Water aerobics Patient will verbalize understanding of everyday activities that increase physical activity by providing examples like the following: Yard work, such as: Insurance underwriter Gardening Washing windows or floors Patient will be able to explain general safety guidelines for exercising:  Before you start a new exercise program, talk with your health care provider. Do not exercise so much that you hurt yourself, feel dizzy, or get very short of breath. Wear comfortable clothes and wear shoes with good support. Drink plenty of water while you exercise to prevent dehydration or heat stroke. Work out until your breathing and your heartbeat get faster.       Patient Stated (pt-stated)       Patient stated that she would like to continue to maintain her current lifestyle.      Patient Stated      Patient states she would like to increase water intake. She would like to stop smoking.         This is a list of the screening recommended for you and due dates:  Health Maintenance  Topic Date Due   Screening for Lung Cancer  Never done   Zoster (Shingles) Vaccine (1 of 2) Never done   DTaP/Tdap/Td vaccine (1 - Tdap) 02/09/2024*   Pneumococcal Vaccine for age over 73 (1 of 2 - PCV) 02/09/2024*   Eye exam for diabetics  11/21/2023   Flu Shot  11/23/2023   Hemoglobin A1C  02/12/2024   Yearly kidney function blood test for diabetes  08/12/2024   Yearly kidney health urinalysis for diabetes  08/12/2024   Complete foot exam   08/12/2024   Medicare Annual Wellness Visit  10/08/2024   Cologuard (Stool DNA test)  08/30/2025   Mammogram  10/02/2025   DEXA scan (bone density measurement)  10/03/2026   HPV Vaccine  Aged Out   Meningitis B Vaccine  Aged Out   COVID-19 Vaccine  Discontinued   Hepatitis C Screening  Discontinued  *Topic was postponed. The date shown is not the original due date.

## 2023-11-02 DIAGNOSIS — H43393 Other vitreous opacities, bilateral: Secondary | ICD-10-CM | POA: Diagnosis not present

## 2023-11-02 DIAGNOSIS — H35423 Microcystoid degeneration of retina, bilateral: Secondary | ICD-10-CM | POA: Diagnosis not present

## 2023-11-02 DIAGNOSIS — H31113 Age-related choroidal atrophy, bilateral: Secondary | ICD-10-CM | POA: Diagnosis not present

## 2023-11-02 DIAGNOSIS — H353231 Exudative age-related macular degeneration, bilateral, with active choroidal neovascularization: Secondary | ICD-10-CM | POA: Diagnosis not present

## 2023-11-02 DIAGNOSIS — H43813 Vitreous degeneration, bilateral: Secondary | ICD-10-CM | POA: Diagnosis not present

## 2023-12-25 ENCOUNTER — Encounter: Payer: Self-pay | Admitting: Sports Medicine

## 2024-01-01 DIAGNOSIS — H35423 Microcystoid degeneration of retina, bilateral: Secondary | ICD-10-CM | POA: Diagnosis not present

## 2024-01-01 DIAGNOSIS — H353231 Exudative age-related macular degeneration, bilateral, with active choroidal neovascularization: Secondary | ICD-10-CM | POA: Diagnosis not present

## 2024-01-01 DIAGNOSIS — H43813 Vitreous degeneration, bilateral: Secondary | ICD-10-CM | POA: Diagnosis not present

## 2024-01-01 DIAGNOSIS — H31113 Age-related choroidal atrophy, bilateral: Secondary | ICD-10-CM | POA: Diagnosis not present

## 2024-01-01 DIAGNOSIS — H43393 Other vitreous opacities, bilateral: Secondary | ICD-10-CM | POA: Diagnosis not present

## 2024-01-29 DIAGNOSIS — Z122 Encounter for screening for malignant neoplasm of respiratory organs: Secondary | ICD-10-CM | POA: Diagnosis not present

## 2024-01-29 DIAGNOSIS — F17210 Nicotine dependence, cigarettes, uncomplicated: Secondary | ICD-10-CM | POA: Diagnosis not present

## 2024-01-30 DIAGNOSIS — R918 Other nonspecific abnormal finding of lung field: Secondary | ICD-10-CM | POA: Diagnosis not present

## 2024-01-30 DIAGNOSIS — Z2821 Immunization not carried out because of patient refusal: Secondary | ICD-10-CM | POA: Diagnosis not present

## 2024-01-30 DIAGNOSIS — F17210 Nicotine dependence, cigarettes, uncomplicated: Secondary | ICD-10-CM | POA: Diagnosis not present

## 2024-02-12 ENCOUNTER — Encounter: Payer: Self-pay | Admitting: Medical-Surgical

## 2024-02-12 ENCOUNTER — Ambulatory Visit: Admitting: Medical-Surgical

## 2024-02-12 VITALS — BP 148/85 | HR 72 | Resp 20 | Ht 61.0 in | Wt 156.1 lb

## 2024-02-12 DIAGNOSIS — I152 Hypertension secondary to endocrine disorders: Secondary | ICD-10-CM | POA: Insufficient documentation

## 2024-02-12 DIAGNOSIS — E1169 Type 2 diabetes mellitus with other specified complication: Secondary | ICD-10-CM | POA: Diagnosis not present

## 2024-02-12 DIAGNOSIS — E1129 Type 2 diabetes mellitus with other diabetic kidney complication: Secondary | ICD-10-CM | POA: Diagnosis not present

## 2024-02-12 DIAGNOSIS — E1159 Type 2 diabetes mellitus with other circulatory complications: Secondary | ICD-10-CM | POA: Diagnosis not present

## 2024-02-12 DIAGNOSIS — E785 Hyperlipidemia, unspecified: Secondary | ICD-10-CM

## 2024-02-12 DIAGNOSIS — R809 Proteinuria, unspecified: Secondary | ICD-10-CM

## 2024-02-12 LAB — POCT GLYCOSYLATED HEMOGLOBIN (HGB A1C)
HbA1c, POC (controlled diabetic range): 5.8 % (ref 0.0–7.0)
Hemoglobin A1C: 5.8 % — AB (ref 4.0–5.6)

## 2024-02-12 MED ORDER — LISINOPRIL 10 MG PO TABS: 5.0000 mg | ORAL_TABLET | Freq: Every day | ORAL | 3 refills | Status: DC

## 2024-02-12 NOTE — Progress Notes (Signed)
 Medical screening examination/treatment was performed by qualified clinical staff member and as supervising provider I was immediately available for consultation/collaboration. I have reviewed documentation and agree with assessment and plan.  Thayer Ohm, DNP, APRN, FNP-BC [facility] and Sports Medicine

## 2024-02-12 NOTE — Progress Notes (Signed)
 Established Patient Office Visit  Subjective   Patient ID: Stefanie Wilson, female    DOB: 1950/11/23  Age: 73 y.o. MRN: [id number]  Chief Complaint  Patient presents with   Diabetes   Hyperlipidemia   73 year old female presents to the office today to follow up on her chronic diseases:  Type 2 Diabetes: -Patient is currently diet controlled for her diabetes. Previous A1C was 6.1% in April 2025. A1C today is 5.8%. Patient does not check her blood sugars and denies having any hypoglycemic episodes.  Hyperlipidemia: -Patient stable on atorvastatin  40 mg daily. Patient denies having any side effects to medication.  Elevated Blood Pressure Reading -Patient is currently taking lisinopril  5 mg tablets daily. She has not taken her medications this morning. BP today is 144/76. Patient denies having any chest pain, shortness of breath, dizziness, or heart palpitations.   Review of Systems  Constitutional: Negative.   HENT: Negative.    Eyes: Negative.   Respiratory: Negative.    Cardiovascular: Negative.   Gastrointestinal: Negative.   Genitourinary: Negative.   Musculoskeletal: Negative.   Skin: Negative.   Neurological: Negative.   Endo/Heme/Allergies: Negative.   Psychiatric/Behavioral: Negative.        Objective:     BP (!) 144/76 (BP Location: Right Arm, Cuff Size: Normal)   Pulse 77   Resp 20   Ht 5' 1 (1.549 m)   Wt 70.8 kg   SpO2 95%   BMI 29.49 kg/m     Physical Exam Vitals and nursing note reviewed.  Constitutional:      General: She is not in acute distress.    Appearance: Normal appearance.  Cardiovascular:     Rate and Rhythm: Normal rate and regular rhythm.     Pulses: Normal pulses.     Heart sounds: Normal heart sounds.  Pulmonary:     Effort: Pulmonary effort is normal.     Breath sounds: Normal breath sounds.  Neurological:     General: No focal deficit present.     Mental Status: She is alert and oriented to person, place, and time.   Psychiatric:        Mood and Affect: Mood normal.        Behavior: Behavior normal.        Thought Content: Thought content normal.        Judgment: Judgment normal.      Results for orders placed or performed in visit on 02/12/24  POCT HgB A1C  Result Value Ref Range   Hemoglobin A1C 5.8 (A) 4.0 - 5.6 %   HbA1c POC (<> result, manual entry)     HbA1c, POC (prediabetic range)     HbA1c, POC (controlled diabetic range) 5.8 0.0 - 7.0 %    Last metabolic panel Lab Results  Component Value Date   GLUCOSE 99 08/13/2023   NA 144 08/13/2023   K 4.2 08/13/2023   CL 107 (H) 08/13/2023   CO2 21 08/13/2023   BUN 8 08/13/2023   CREATININE 0.80 08/13/2023   EGFR 78 08/13/2023   CALCIUM  8.7 08/13/2023   PROT 6.7 08/13/2023   ALBUMIN 4.0 08/13/2023   LABGLOB 2.7 08/13/2023   BILITOT 0.3 08/13/2023   ALKPHOS 92 08/13/2023   AST 20 08/13/2023   ALT 17 08/13/2023   Last lipids Lab Results  Component Value Date   CHOL 109 08/13/2023   HDL 31 (L) 08/13/2023   LDLCALC 52 08/13/2023   TRIG 150 (H) 08/13/2023   CHOLHDL  3.5 08/13/2023   Last hemoglobin A1c Lab Results  Component Value Date   HGBA1C 5.8 (A) 02/12/2024   HGBA1C 5.8 02/12/2024      The ASCVD Risk score (Arnett DK, et al., 2019) failed to calculate for the following reasons:   The valid total cholesterol range is 130 to 320 mg/dL    Assessment & Plan:  1. Type 2 diabetes mellitus with diabetic microalbuminuria, without long-term current use of insulin (HCC) (Primary) -Continue diet and exercise modifications for diabetes management - POCT HgB A1C  2. Hyperlipidemia associated with type 2 diabetes mellitus (HCC) -Continue atorvastatin  40 mg daily -Repeat labs in 6 months - POCT HgB A1C  3. Hypertension associated with type 2 diabetes mellitus (HCC) -Increase lisinopril  to 10 mg daily -Recommend to continue working on smoking cessation -Check blood pressure three times per week at home -Return in 2 weeks  for a Nurse visit BP check    Return in about 2 weeks (around 02/26/2024) for Nurse Visit BP Check.    Derrek JINNY Freund, NP Student

## 2024-03-03 ENCOUNTER — Ambulatory Visit: Payer: Self-pay

## 2024-03-03 NOTE — Telephone Encounter (Signed)
 This task has been completed as requested. Please review other TE for additional information. Patient's daughter Boby notified of the update. She will call the clinic to schedule the NV as requested. Unable to complete the request at time of the call. No other inquiries during the call. No further action needed.

## 2024-03-03 NOTE — Telephone Encounter (Signed)
 Pt says her BP medication her pcp put her on isn't working at all. Today 157/70. Pt wants to know if she can take a whole tablet instead of half. She wants to know if that will help.

## 2024-03-03 NOTE — Telephone Encounter (Signed)
 FYI Only or Action Required?: Action required by provider: clinical question for provider.  Patient was last seen in primary care on 02/12/2024 by Willo Mini, NP.  Called Nurse Triage reporting Medication Problem and Hypertension.  Symptoms began today.  Interventions attempted: Prescription medications: Lisinopril .  Symptoms are: unchanged.  Triage Disposition: See PCP When Office is Open (Within 3 Days)  Patient/caregiver understands and will follow disposition?: No, wishes to speak with PCP   Copied from CRM #[id number]. Topic: Clinical - Red Word Triage >> Mar 03, 2024 11:48 AM Gustabo D wrote: Pt says her BP medication her pcp put her on isn't working at all. Today 157/70. Pt wants to know if she can take a whole tablet instead of half. She wants to know if that will help. Reason for Disposition  [1] Taking BP medications AND [2] feels is having side effects (e.g., impotence, cough, dizzy upon standing)  Answer Assessment - Initial Assessment Questions Patient reports taking 1/2 pill of Lisinopril  10 mg, as directed and not working; patient requesting if can take entire pill.  Patient requesting call back, declines appt.  Advised Call back or UC/ED if symptoms worsen.  1. BLOOD PRESSURE: What is your blood pressure? Did you take at least two measurements 5 minutes apart?     157/76; last taken 1150 2. ONSET: When did you take your blood pressure?     Today;151/81 HR 80 3. HOW: How did you take your blood pressure? (e.g., automatic home BP monitor, visiting nurse)     Auto bp arm 4. HISTORY: Do you have a history of high blood pressure?     HTN 5. MEDICINES: Are you taking any medicines for blood pressure? Have you missed any doses recently?     lisinopril  6. OTHER SYMPTOMS: Do you have any symptoms? (e.g., blurred vision, chest pain, difficulty breathing, headache, weakness)     Denies HA, blurred vision, chest pain, diff breathing , weakness/  numbness  Protocols used: Blood Pressure - High-A-AH

## 2024-03-04 DIAGNOSIS — H43391 Other vitreous opacities, right eye: Secondary | ICD-10-CM | POA: Diagnosis not present

## 2024-03-04 DIAGNOSIS — H35433 Paving stone degeneration of retina, bilateral: Secondary | ICD-10-CM | POA: Diagnosis not present

## 2024-03-04 DIAGNOSIS — H35423 Microcystoid degeneration of retina, bilateral: Secondary | ICD-10-CM | POA: Diagnosis not present

## 2024-03-04 DIAGNOSIS — H353231 Exudative age-related macular degeneration, bilateral, with active choroidal neovascularization: Secondary | ICD-10-CM | POA: Diagnosis not present

## 2024-03-04 DIAGNOSIS — H43813 Vitreous degeneration, bilateral: Secondary | ICD-10-CM | POA: Diagnosis not present

## 2024-03-04 DIAGNOSIS — H353133 Nonexudative age-related macular degeneration, bilateral, advanced atrophic without subfoveal involvement: Secondary | ICD-10-CM | POA: Diagnosis not present

## 2024-03-04 LAB — OPHTHALMOLOGY REPORT-SCANNED

## 2024-04-15 ENCOUNTER — Ambulatory Visit (INDEPENDENT_AMBULATORY_CARE_PROVIDER_SITE_OTHER): Admitting: Medical-Surgical

## 2024-04-15 ENCOUNTER — Encounter: Payer: Self-pay | Admitting: Medical-Surgical

## 2024-04-15 VITALS — BP 149/84 | HR 84 | Temp 98.1°F | Resp 18 | Ht 61.0 in | Wt 152.1 lb

## 2024-04-15 DIAGNOSIS — H353231 Exudative age-related macular degeneration, bilateral, with active choroidal neovascularization: Secondary | ICD-10-CM | POA: Diagnosis not present

## 2024-04-15 DIAGNOSIS — J189 Pneumonia, unspecified organism: Secondary | ICD-10-CM | POA: Diagnosis not present

## 2024-04-15 DIAGNOSIS — I152 Hypertension secondary to endocrine disorders: Secondary | ICD-10-CM

## 2024-04-15 DIAGNOSIS — E1159 Type 2 diabetes mellitus with other circulatory complications: Secondary | ICD-10-CM

## 2024-04-15 MED ORDER — LISINOPRIL 10 MG PO TABS: 10.0000 mg | ORAL_TABLET | Freq: Every day | ORAL | 0 refills | Status: DC

## 2024-04-15 MED ORDER — LISINOPRIL 10 MG PO TABS: 10.0000 mg | ORAL_TABLET | Freq: Every day | ORAL | 3 refills | Status: DC

## 2024-04-15 NOTE — Progress Notes (Deleted)

## 2024-04-15 NOTE — Progress Notes (Signed)
 Medical screening examination/treatment was performed by qualified clinical staff member and as supervising provider I was immediately available for consultation/collaboration. I have reviewed documentation and agree with assessment and plan.  Thayer Ohm, DNP, APRN, FNP-BC [facility] and Sports Medicine

## 2024-04-15 NOTE — Progress Notes (Addendum)
 "  Established Patient Office Visit  Subjective   Patient ID: Stefanie Wilson, female    DOB: March 07, 1951  Age: 73 y.o. MRN: [id number]  Chief Complaint  Patient presents with   Medical Management of Chronic Issues    Pneumonia (L) lung follow up   Back Pain    HPI  73 year old female presents for pneumonia follow up.   Pneumonia  Patient was seen at urgent on 12/20 and was diagnosed with bilateral multifocal pneumonia and interstitial lung disease. Patient was treated with doxycycline 100 mg twice daily x 7 days, azithromycin 250 mg x 5 days, Augmentin 875-125 mg x 10 days, tessalon capsules, and albuterol as needed with good relief. Denies shortness of breath.  Hypertension Patient with elevated BP in office today. Patient states that she has been off of her blood pressure medication for about a week. States that she has not received a delivery from mail order.  Macular degeneration Patient is currently followed by Ophthalmology and receives eye injections every 8 weeks.  Review of Systems  Constitutional: Negative.   HENT: Negative.    Eyes: Negative.   Respiratory: Negative.    Cardiovascular: Negative.   Gastrointestinal: Negative.   Genitourinary: Negative.   Musculoskeletal: Negative.   Skin: Negative.   Neurological: Negative.   Endo/Heme/Allergies: Negative.   Psychiatric/Behavioral: Negative.        Objective:     BP (!) 149/84 (BP Location: Right Arm, Patient Position: Sitting, Cuff Size: Normal)   Pulse 84   Temp 98.1 F (36.7 C) (Oral)   Resp 18   Ht 5' 1 (1.549 m)   Wt 69 kg   SpO2 98%   BMI 28.74 kg/m  BP Readings from Last 3 Encounters:  04/15/24 (!) 149/84  02/12/24 (!) 148/85  08/13/23 137/77   Wt Readings from Last 3 Encounters:  04/15/24 69 kg  02/12/24 70.8 kg  10/09/23 69.9 kg      Physical Exam Vitals and nursing note reviewed.  Constitutional:      General: She is not in acute distress.    Appearance: Normal appearance.   Cardiovascular:     Rate and Rhythm: Normal rate and regular rhythm.     Pulses: Normal pulses.     Heart sounds: Normal heart sounds.  Pulmonary:     Effort: Pulmonary effort is normal.     Breath sounds: Normal breath sounds.  Neurological:     General: No focal deficit present.     Mental Status: She is alert and oriented to person, place, and time.  Psychiatric:        Mood and Affect: Mood normal.        Behavior: Behavior normal.        Thought Content: Thought content normal.        Judgment: Judgment normal.     No results found for any visits on 04/15/24.   The ASCVD Risk score (Arnett DK, et al., 2019) failed to calculate for the following reasons:   The valid total cholesterol range is 130 to 320 mg/dL    Assessment & Plan:   1. Pneumonia of both lungs due to infectious organism, unspecified part of lung (Primary) -Continue with current antibiotic therapy, cough suppressant, and inhaler as needed -Doxycycline 100 mg twice daily, azithromycin 250 mg x 5 days, Augmentin 875-125 mg x 10 days -Educated on the importance of completing antibiotics even if symptoms are better. -Continue working on smoking cessation -Chest x-ray ordered to assess  response to therapy - DG Chest 2 View; Future  2. Hypertension associated with type 2 diabetes mellitus (HCC) - Restart Lisinopril  10 mg  - Elevated Initial BP 152/84 and repeat 149/84 -Recommended close BP monitoring at home and keep a log of BP to bring to next visit - lisinopril  (ZESTRIL ) 10 MG tablet; Take 1 tablet (10 mg total) by mouth daily.  Dispense: 90 tablet; Refill: 3  3. Exudative age-related macular degeneration, bilateral, with active choroidal neovascularization (HCC) -Closely followed by ophthalmology.   Return in about 2 weeks (around 04/29/2024) for nurse visit for BP check.    Derrek Freund, NP Student  "

## 2024-04-28 NOTE — Progress Notes (Signed)
" ° °  Subjective:    Patient ID: Stefanie Wilson, female    DOB: [birth year], 74 y.o.   MRN: [id number]  HPI  Patient is here for a 2 week BP recheck. Last OV it was 149/84 patient has been off of her blood pressure medication for about a week. She takes lisinopril  10 mg daily. Denies CP, SOB, headache, medication issues, or vision changes   Review of Systems     Objective:   Physical Exam        Assessment & Plan:   Patients first BP reading is 182/80 with our cuff 150/81 with her cuff. second is 153/79. Reported to Joy who would like the patient to increase her lisinopril  10 mg to 20 mg and rtc in 2 wks for a BP NV. Patient stated she had to leave and for me to call her daughter, upon talking to her she was asking if her mom made the 4 wk f/u with joy to make sure the pneumonia had cleared. She wanted to be seen by Zada and will do BP check when they come in to see her. I have pt scheduled for 05/13/24 "

## 2024-04-29 ENCOUNTER — Ambulatory Visit (INDEPENDENT_AMBULATORY_CARE_PROVIDER_SITE_OTHER)

## 2024-04-29 VITALS — BP 150/81 | HR 74 | Resp 18 | Ht 61.0 in | Wt 152.0 lb

## 2024-04-29 DIAGNOSIS — I152 Hypertension secondary to endocrine disorders: Secondary | ICD-10-CM

## 2024-04-29 DIAGNOSIS — E1159 Type 2 diabetes mellitus with other circulatory complications: Secondary | ICD-10-CM | POA: Diagnosis not present

## 2024-04-29 MED ORDER — LISINOPRIL 20 MG PO TABS: 20.0000 mg | ORAL_TABLET | Freq: Every day | ORAL | 0 refills | Status: DC

## 2024-04-29 NOTE — Addendum Note (Signed)
 Addended byBETHA WILLO MINI on: 04/29/2024 12:42 PM   Modules accepted: Orders

## 2024-05-13 ENCOUNTER — Encounter: Payer: Self-pay | Admitting: Medical-Surgical

## 2024-05-13 ENCOUNTER — Ambulatory Visit: Admitting: Medical-Surgical

## 2024-05-13 VITALS — BP 156/84 | HR 76 | Temp 97.8°F | Resp 20 | Ht 61.0 in | Wt 151.1 lb

## 2024-05-13 DIAGNOSIS — J189 Pneumonia, unspecified organism: Secondary | ICD-10-CM | POA: Diagnosis not present

## 2024-05-13 DIAGNOSIS — Z23 Encounter for immunization: Secondary | ICD-10-CM | POA: Diagnosis not present

## 2024-05-13 DIAGNOSIS — E1159 Type 2 diabetes mellitus with other circulatory complications: Secondary | ICD-10-CM

## 2024-05-13 DIAGNOSIS — I152 Hypertension secondary to endocrine disorders: Secondary | ICD-10-CM

## 2024-05-13 MED ORDER — LISINOPRIL 30 MG PO TABS: 30.0000 mg | ORAL_TABLET | Freq: Every day | ORAL | 0 refills | Status: AC

## 2024-05-13 NOTE — Patient Instructions (Addendum)
 If taking 10mg  lisinopril  tablets- please take 3 tablets (30mg ) nightly  If taking 20mg  lisinopril  tablets- please take 1.5 tablets (30) nightly  A new prescription for 30mg  tablets has been sent to the [facility] pharmacy. You will only need to take 1 tablet nightly once you have picked this up.

## 2024-05-13 NOTE — Progress Notes (Signed)
" ° °       Established patient visit   History of Present Illness   Discussed the use of AI scribe software for clinical note transcription with the patient, who gave verbal consent to proceed.  History of Present Illness   Stefanie Wilson is a 74 year old female with hypertension who presents for follow-up on pneumonia and blood pressure management.  Respiratory symptoms and pneumonia follow-up - Completed courses of doxycycline, azithromycin, and Augmentin for pneumonia (started 04/12/24, ended 04/22/24) - Currently asymptomatic with no cough, shortness of breath, or fever  Immunization status - Presenting for pneumonia vaccine today - Received pneumonia vaccine last year with only mild arm soreness  Hypertension management - Home blood pressures remain elevated while taking lisinopril  20 mg nightly (two 10 mg tablets) - Home logs show BP from 130-150s/80. - No chest pain or shortness of breath - Avoids adding salt and processed foods - Son cooks with minimal salt  Tobacco use - Continues to smoke - Daughters encourage her to quit     Physical Exam   Physical Exam Vitals reviewed.  Constitutional:      General: She is not in acute distress.    Appearance: Normal appearance. She is not ill-appearing.  HENT:     Head: Normocephalic and atraumatic.  Cardiovascular:     Rate and Rhythm: Normal rate and regular rhythm.     Pulses: Normal pulses.     Heart sounds: Normal heart sounds. No murmur heard.    No friction rub. No gallop.  Pulmonary:     Effort: Pulmonary effort is normal. No respiratory distress.     Breath sounds: Normal breath sounds. No wheezing.  Skin:    General: Skin is warm and dry.  Neurological:     Mental Status: She is alert and oriented to person, place, and time.  Psychiatric:        Mood and Affect: Mood normal.        Behavior: Behavior normal.        Thought Content: Thought content normal.        Judgment: Judgment normal.    Assessment &  Plan    Assessment and Plan    Hypertension associated with type 2 diabetes mellitus Hypertension uncontrolled despite lisinopril  20 mg. Contributing factors include smoking, possible sleep apnea, and dietary salt intake. - Increased lisinopril  to 30 mg daily. If 20 mg tablets available, take one and a half tablets daily. If 10 mg tablets available, take three tablets daily. - Sent prescription for 30 mg lisinopril  to pharmacy. - Scheduled follow-up in two weeks for blood pressure check. - Advised daily home blood pressure monitoring and recording.  Recent pneumonia, under follow-up Pneumonia treated with antibiotics. No residual symptoms reported. Lungs clear. - Ordered chest x-ray to verify resolution. - Schedule chest x-ray with follow-up visit in two weeks.  Pneumococcal vaccination, planned Vaccination planned despite concerns about adverse reactions. - Administered Prevnar 20 today.     Follow up   Return in about 2 weeks (around 05/27/2024) for nurse visit for BP check. __________________________________ Zada FREDRIK Palin, DNP, APRN, FNP-BC Primary Care and Sports Medicine [facility] [place] "

## 2024-05-29 ENCOUNTER — Ambulatory Visit

## 2024-06-02 ENCOUNTER — Ambulatory Visit

## 2024-08-12 ENCOUNTER — Ambulatory Visit: Admitting: Medical-Surgical

## 2024-10-09 ENCOUNTER — Ambulatory Visit

## 2024-10-21 ENCOUNTER — Ambulatory Visit
